# Patient Record
Sex: Female | Born: 1940 | Race: White | Hispanic: No | Marital: Married | Smoking: Former smoker
Health system: Southern US, Community
[De-identification: ages and names within clinical notes are randomized; demographics above are authoritative.]

## PROBLEM LIST (undated history)

## (undated) DIAGNOSIS — R42 Dizziness and giddiness: Secondary | ICD-10-CM

## (undated) DIAGNOSIS — R55 Syncope and collapse: Secondary | ICD-10-CM

## (undated) DIAGNOSIS — M79646 Pain in unspecified finger(s): Secondary | ICD-10-CM

## (undated) DIAGNOSIS — R202 Paresthesia of skin: Secondary | ICD-10-CM

## (undated) DIAGNOSIS — S61011A Laceration without foreign body of right thumb without damage to nail, initial encounter: Secondary | ICD-10-CM

## (undated) HISTORY — DX: Paresthesia of skin: R20.2

## (undated) HISTORY — PX: URETER SURGERY: SHX823

## (undated) HISTORY — DX: Pain in unspecified finger(s): M79.646

## (undated) HISTORY — PX: TONSILLECTOMY: SUR1361

## (undated) HISTORY — DX: Dizziness and giddiness: R42

## (undated) HISTORY — DX: Syncope and collapse: R55

## (undated) HISTORY — DX: Laceration without foreign body of right thumb without damage to nail, initial encounter: S61.011A

---

## 1998-05-25 ENCOUNTER — Other Ambulatory Visit: Admission: RE | Admit: 1998-05-25 | Discharge: 1998-05-25 | Payer: Self-pay

## 1998-08-03 ENCOUNTER — Other Ambulatory Visit: Admission: RE | Admit: 1998-08-03 | Discharge: 1998-08-03 | Payer: Self-pay | Admitting: Obstetrics and Gynecology

## 1999-08-23 ENCOUNTER — Other Ambulatory Visit: Admission: RE | Admit: 1999-08-23 | Discharge: 1999-08-23 | Payer: Self-pay | Admitting: Obstetrics and Gynecology

## 2000-08-26 ENCOUNTER — Other Ambulatory Visit: Admission: RE | Admit: 2000-08-26 | Discharge: 2000-08-26 | Payer: Self-pay | Admitting: Obstetrics and Gynecology

## 2001-04-02 ENCOUNTER — Ambulatory Visit (HOSPITAL_COMMUNITY): Admission: RE | Admit: 2001-04-02 | Discharge: 2001-04-02 | Payer: Self-pay | Admitting: Gastroenterology

## 2001-04-02 ENCOUNTER — Encounter (INDEPENDENT_AMBULATORY_CARE_PROVIDER_SITE_OTHER): Payer: Self-pay | Admitting: Specialist

## 2001-10-25 ENCOUNTER — Other Ambulatory Visit: Admission: RE | Admit: 2001-10-25 | Discharge: 2001-10-25 | Payer: Self-pay | Admitting: Obstetrics and Gynecology

## 2001-11-23 ENCOUNTER — Encounter: Admission: RE | Admit: 2001-11-23 | Discharge: 2002-02-21 | Payer: Self-pay | Admitting: Obstetrics and Gynecology

## 2002-04-01 ENCOUNTER — Ambulatory Visit (HOSPITAL_COMMUNITY): Admission: RE | Admit: 2002-04-01 | Discharge: 2002-04-01 | Payer: Self-pay | Admitting: Gastroenterology

## 2002-04-01 ENCOUNTER — Encounter (INDEPENDENT_AMBULATORY_CARE_PROVIDER_SITE_OTHER): Payer: Self-pay | Admitting: Specialist

## 2002-11-17 ENCOUNTER — Other Ambulatory Visit: Admission: RE | Admit: 2002-11-17 | Discharge: 2002-11-17 | Payer: Self-pay | Admitting: Obstetrics and Gynecology

## 2004-02-29 ENCOUNTER — Other Ambulatory Visit: Admission: RE | Admit: 2004-02-29 | Discharge: 2004-02-29 | Payer: Self-pay | Admitting: Obstetrics and Gynecology

## 2005-04-15 ENCOUNTER — Other Ambulatory Visit: Admission: RE | Admit: 2005-04-15 | Discharge: 2005-04-15 | Payer: Self-pay | Admitting: Obstetrics and Gynecology

## 2010-07-10 ENCOUNTER — Encounter: Admission: RE | Admit: 2010-07-10 | Discharge: 2010-07-10 | Payer: Self-pay | Admitting: Gastroenterology

## 2010-07-10 IMAGING — CT CT ABD-PEL WO/W CM
3 of 8 series · 13 of 36 positions shown, 17 images · IV contrast (OMNI 300, WATER & [ID] OMNI 300)
Comparison: None.

CLINICAL DATA: Follow up of submucosal cecal mass adjacent to the
appendix noted on colonoscopy

CT ABDOMEN AND PELVIS WITHOUT AND WITH CONTRAST
TECHNIQUE: Multidetector CT imaging of the abdomen and pelvis was
performed without contrast material in one or both body regions,
followed by contrast material(s) and further sections in one or
both body regions.
Contrast: 100 ml [5F]

[Series 3: abd w/(date) · axial · 0.70mm/px · z∈[-199,-124]mm · 2 of 46 slices shown]
[im 16/46  soft-tissue]
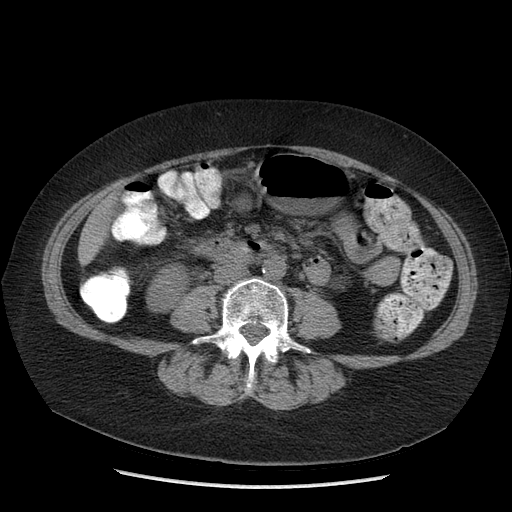
[im 31/46  soft-tissue]
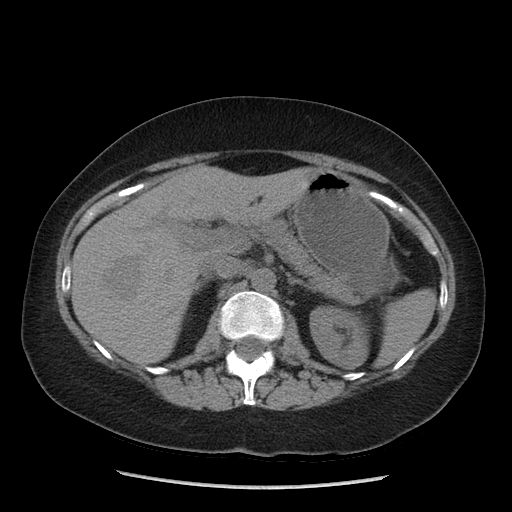

[Series 5: routine abdomen · axial · 0.70mm/px · z∈[-294,-124]mm · 3 of 67 slices shown, 7 images]
[im 17/67  soft-tissue]
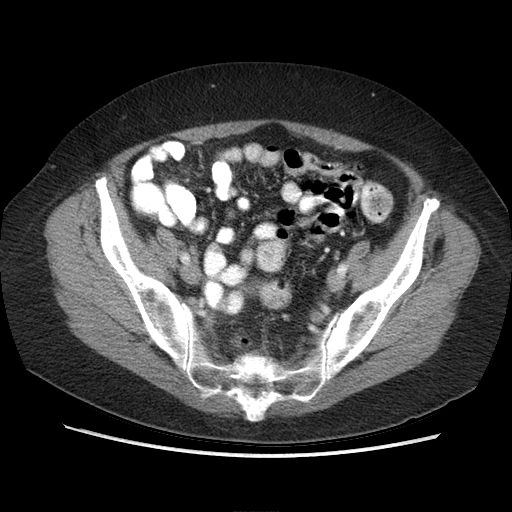
[im 17/67  lung]
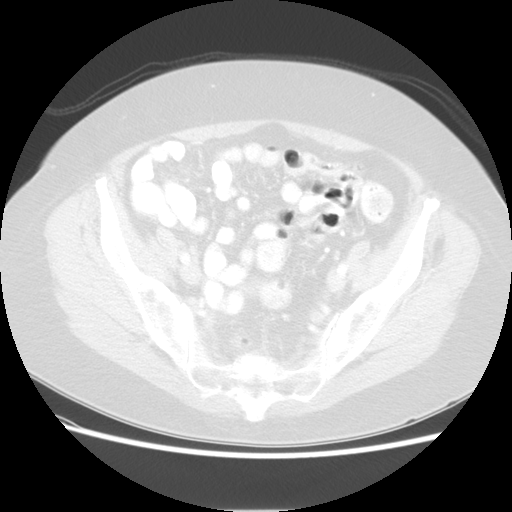
[im 17/67  bone]
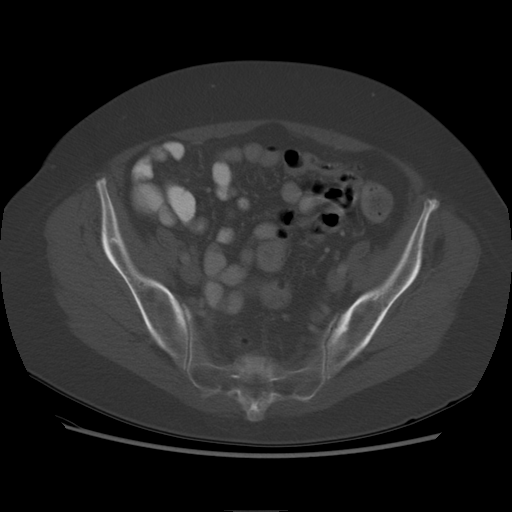
[im 34/67  soft-tissue]
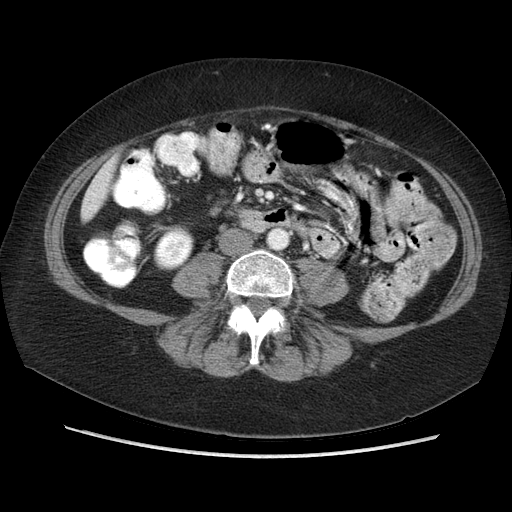
[im 34/67  lung]
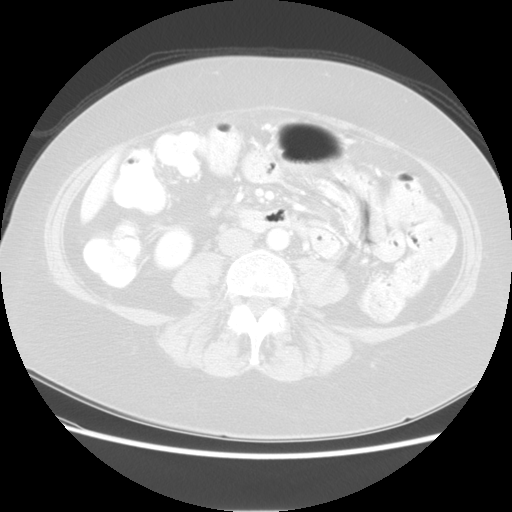
[im 50/67  soft-tissue]
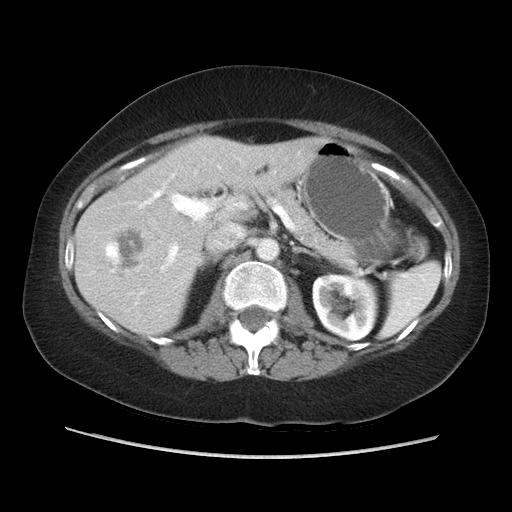
[im 50/67  lung]
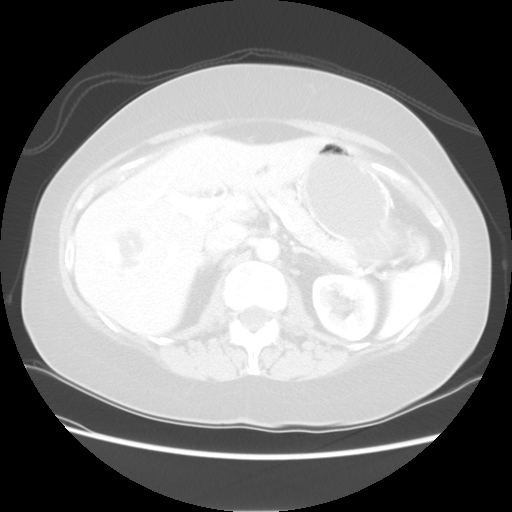

[Series 605: sagittal body · sagittal · 0.77mm/px · 8 of 145 slices shown]
[im 17/145  soft-tissue]
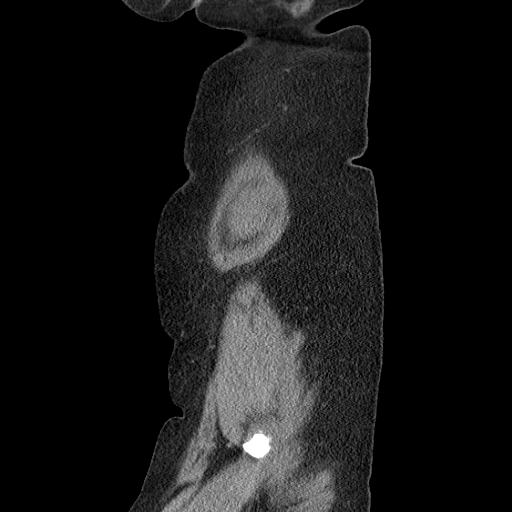
[im 33/145  soft-tissue]
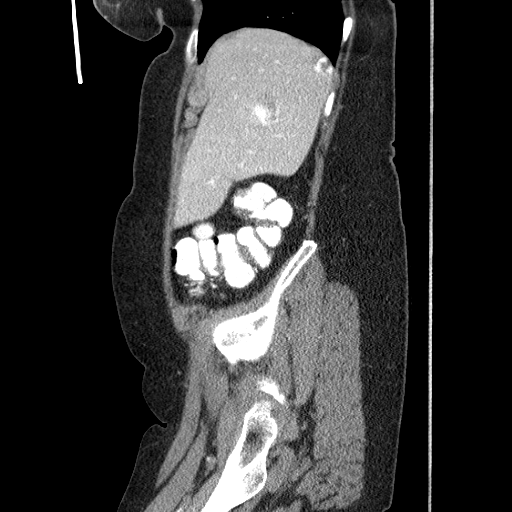
[im 49/145  soft-tissue]
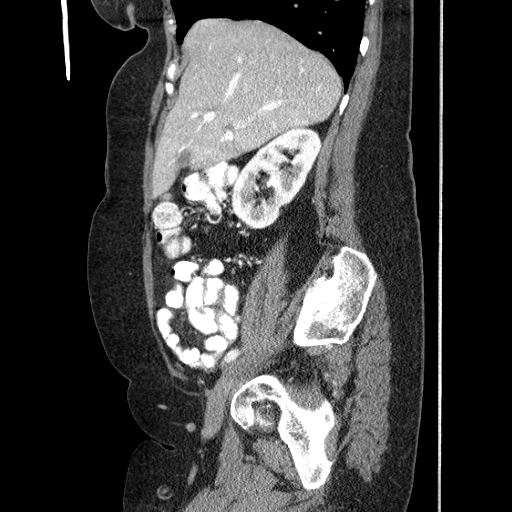
[im 65/145  soft-tissue]
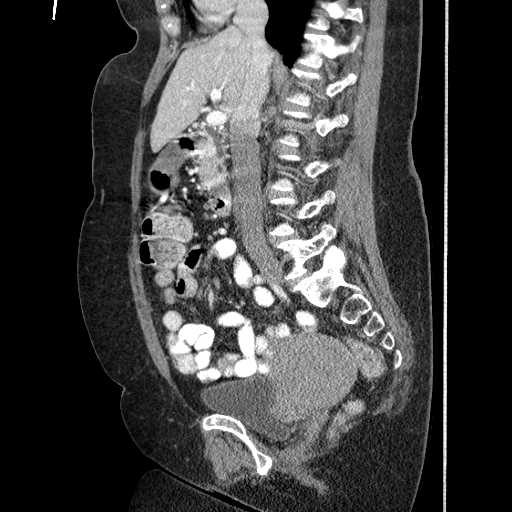
[im 81/145  soft-tissue]
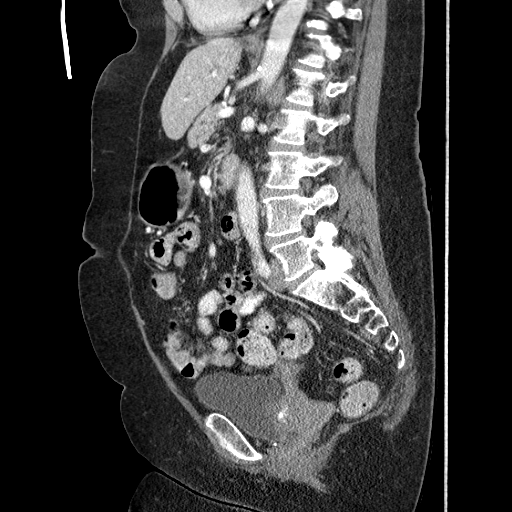
[im 97/145  soft-tissue]
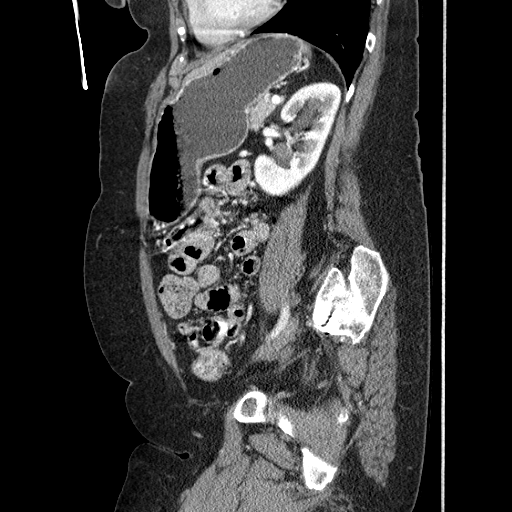
[im 113/145  soft-tissue]
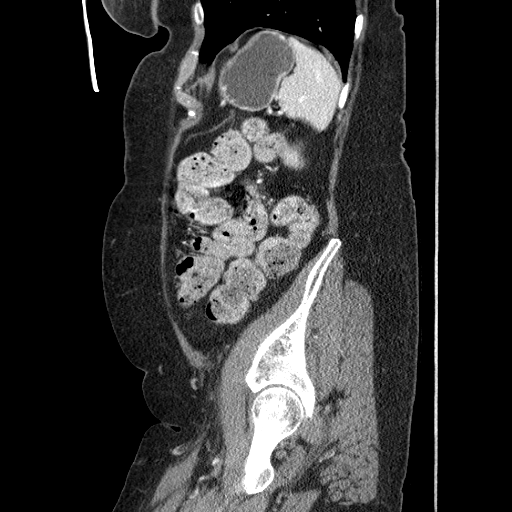
[im 129/145  soft-tissue]
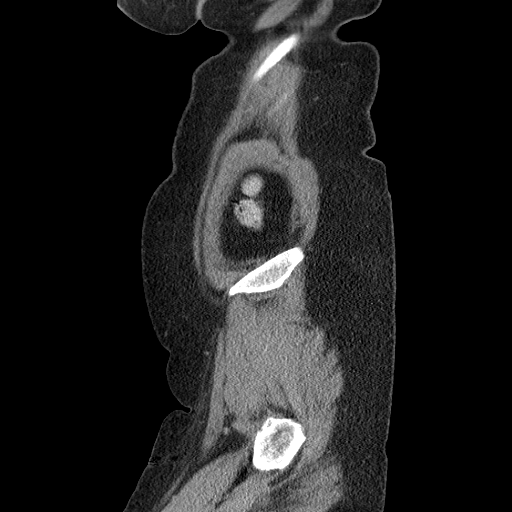

[13 of 36 positions shown; findings below may reference images not displayed]

FINDINGS: The lung bases are clear.  On the unenhanced study, there
is a low attenuation lesion in the mid right lobe of liver.  No
calcified gallstones are seen and no renal calculi are noted.

After contrast administration, there are two lesions in the right
lobe of liver which demonstrate peripheral nodular enhancement.  On
delayed images these areas become slightly hyperdense relative to
the adjacent liver and these are consistent with hemangiomas with
probable some fatty infiltration of the liver. The larger of these
two hepatic lesions measures 36 x 30 mm with the smaller measuring
14 x 15 mm.  No ductal dilatation is seen.  The gallbladder is
unremarkable.  The pancreas is normal in size and the pancreatic
duct is not dilated. The adrenal glands and spleen are
unremarkable.  The kidneys enhance and there is a right inferior -
medial parapelvic cyst present.  However, there is fullness of the
left pelvis and proximal left ureter with slightly higher
attenuation on unenhanced images.  On delayed images this enhanced
portion represents filling defect within the left renal pelvis and
proximal left ureter.  The considerations are that of blood clot
versus tumor such as transitional cell carcinoma.  Is there a
history of hematuria?

The abdominal aorta is normal in caliber.  No adenopathy is seen.
The ileocecal valve is noted to be somewhat nodular and lipomatous.
However, no soft tissue mass is seen adjacent to the cecum as
questioned on colonoscopy.  The terminal ileum is unremarkable.
The appendix is not well seen.  A few small nodes are present
within the right lower quadrant none of which are pathologically
enlarged.  The uterus is slightly prominent and inhomogeneous and
there is a calcified uterine fibroid present.  The urinary bladder
is unremarkable. No adnexal lesion is seen and no fluid is noted
within the pelvis.  A blastic focus in L5 vertebral body probably
represents a benign bone island.
IMPRESSION: 1.  There is filling defect within the left renal pelvis of
proximally, with some dilatation of the pelvocaliceal system on the
left.  This could represent either blood clot or tumor such as
transitional cell carcinoma.
2.  No soft tissue mass is seen adjacent to the cecum.  The
appendix is not definitely visualized. Probable lipomatou am I
always the last to know?

s ileocecal valve.
3.  Two lesions within the liver most consistent with hemangiomas.

 I discussed these findings with Dr. YAYGIN at [DATE] p.m.

## 2010-07-31 ENCOUNTER — Ambulatory Visit (HOSPITAL_BASED_OUTPATIENT_CLINIC_OR_DEPARTMENT_OTHER): Admission: RE | Admit: 2010-07-31 | Discharge: 2010-07-31 | Payer: Self-pay | Admitting: Urology

## 2010-07-31 IMAGING — CR DG CHEST 2V
2 series · 2 of 2 positions shown · non-contrast
Comparison: None

CLINICAL DATA: Preoperative respiratory exam for urologic surgery.
Possible carcinoma.

CHEST - 2 VIEW

[w chest pa]
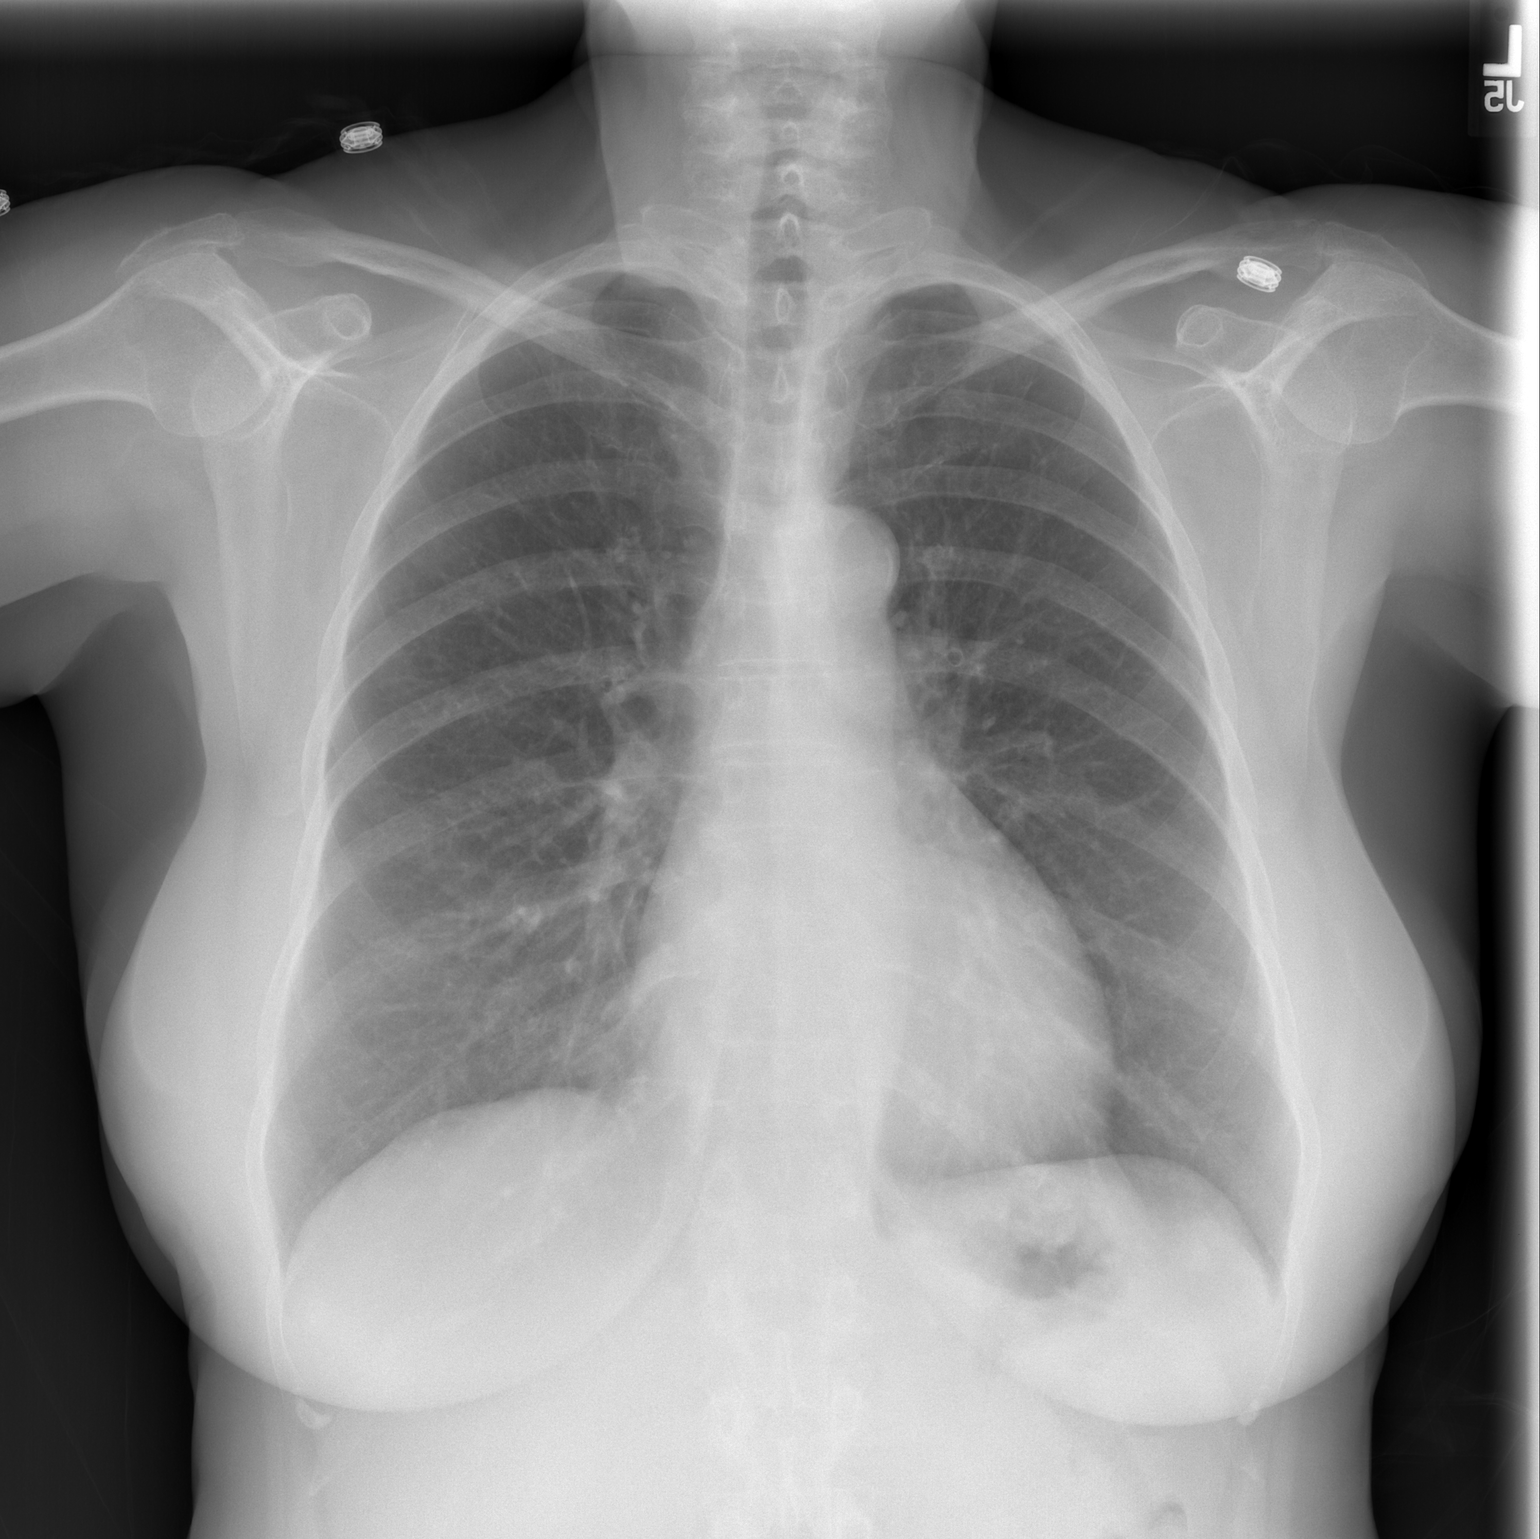

[w chest lat]
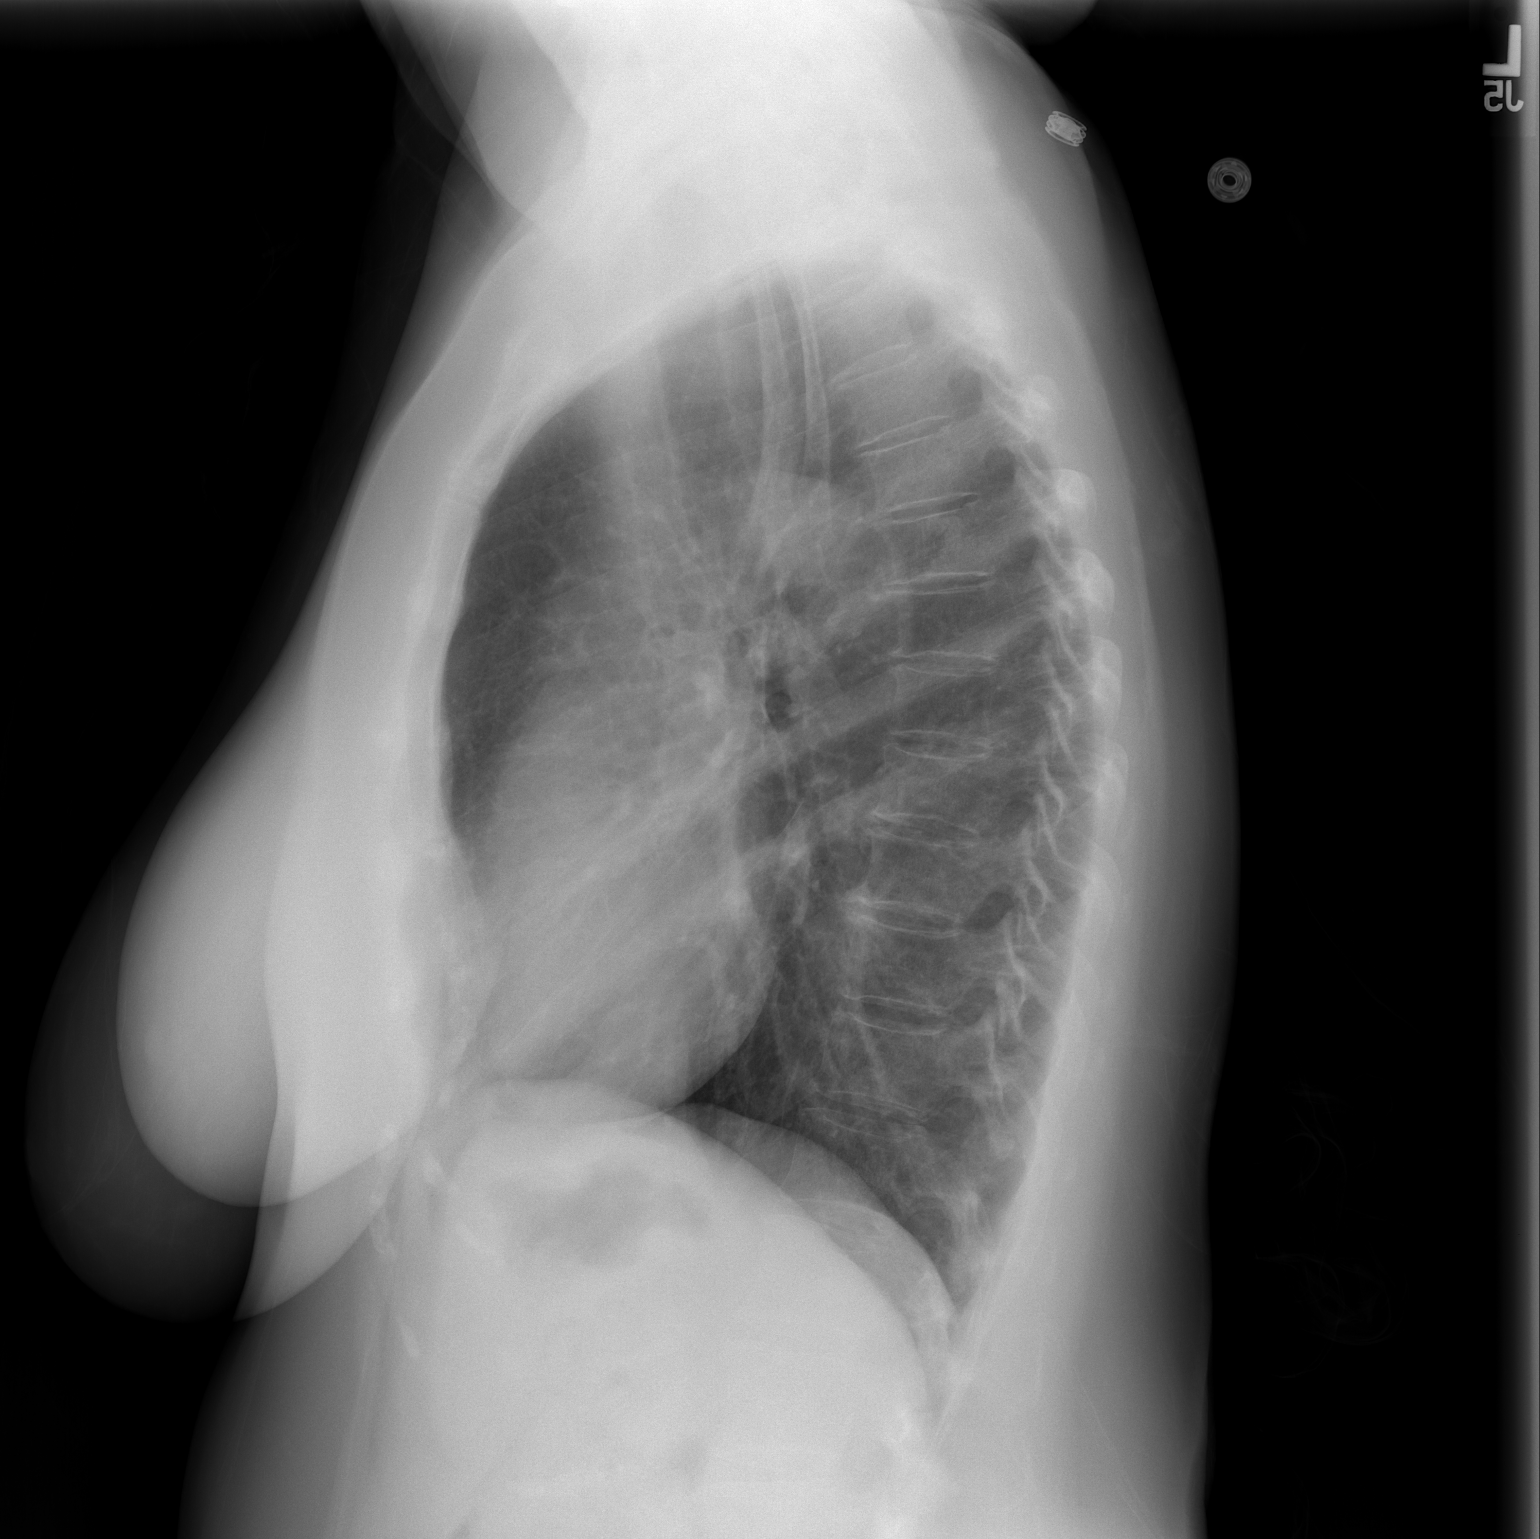

[2 of 2 positions shown; findings below may reference images not displayed]

FINDINGS: Heart size is normal.  Mediastinal shadows are normal
except for calcification of the thoracic aorta.  Lungs are clear.
The vascularity is normal.  Ordinary degenerative changes effect
the spine.
IMPRESSION: No active disease.

## 2010-11-23 ENCOUNTER — Encounter: Payer: Self-pay | Admitting: Family Medicine

## 2011-01-16 LAB — POCT HEMOGLOBIN-HEMACUE: Hemoglobin: 13.2 g/dL (ref 12.0–15.0)

## 2013-09-28 ENCOUNTER — Other Ambulatory Visit: Payer: Self-pay | Admitting: Obstetrics and Gynecology

## 2013-09-28 DIAGNOSIS — R928 Other abnormal and inconclusive findings on diagnostic imaging of breast: Secondary | ICD-10-CM

## 2013-10-17 ENCOUNTER — Ambulatory Visit
Admission: RE | Admit: 2013-10-17 | Discharge: 2013-10-17 | Disposition: A | Discharge status: Home / Self Care | Payer: Medicare HMO | Source: Ambulatory Visit | Attending: Obstetrics and Gynecology | Admitting: Obstetrics and Gynecology

## 2013-10-17 DIAGNOSIS — R928 Other abnormal and inconclusive findings on diagnostic imaging of breast: Secondary | ICD-10-CM

## 2014-06-05 ENCOUNTER — Other Ambulatory Visit: Payer: Self-pay | Admitting: Obstetrics and Gynecology

## 2014-06-05 DIAGNOSIS — N6489 Other specified disorders of breast: Secondary | ICD-10-CM

## 2014-06-12 ENCOUNTER — Ambulatory Visit
Admission: RE | Admit: 2014-06-12 | Discharge: 2014-06-12 | Disposition: A | Discharge status: Home / Self Care | Payer: Medicare HMO | Source: Ambulatory Visit | Attending: Obstetrics and Gynecology | Admitting: Obstetrics and Gynecology

## 2014-06-12 DIAGNOSIS — N6489 Other specified disorders of breast: Secondary | ICD-10-CM

## 2014-09-26 ENCOUNTER — Other Ambulatory Visit: Payer: Self-pay | Admitting: Obstetrics and Gynecology

## 2014-09-26 DIAGNOSIS — N6489 Other specified disorders of breast: Secondary | ICD-10-CM

## 2014-09-29 LAB — CYTOLOGY - PAP

## 2014-10-16 ENCOUNTER — Ambulatory Visit
Admission: RE | Admit: 2014-10-16 | Discharge: 2014-10-16 | Disposition: A | Discharge status: Home / Self Care | Payer: Medicare HMO | Source: Ambulatory Visit | Attending: Obstetrics and Gynecology | Admitting: Obstetrics and Gynecology

## 2014-10-16 DIAGNOSIS — N6489 Other specified disorders of breast: Secondary | ICD-10-CM

## 2015-07-30 ENCOUNTER — Other Ambulatory Visit: Payer: Self-pay | Admitting: Family Medicine

## 2015-07-30 DIAGNOSIS — N6489 Other specified disorders of breast: Secondary | ICD-10-CM

## 2015-11-12 ENCOUNTER — Ambulatory Visit
Admission: RE | Admit: 2015-11-12 | Discharge: 2015-11-12 | Disposition: A | Discharge status: Home / Self Care | Payer: Medicare Other | Source: Ambulatory Visit | Attending: Family Medicine | Admitting: Family Medicine

## 2015-11-12 DIAGNOSIS — N6489 Other specified disorders of breast: Secondary | ICD-10-CM

## 2016-10-13 ENCOUNTER — Other Ambulatory Visit: Payer: Self-pay | Admitting: Family Medicine

## 2016-10-13 DIAGNOSIS — Z1231 Encounter for screening mammogram for malignant neoplasm of breast: Secondary | ICD-10-CM

## 2016-11-24 ENCOUNTER — Ambulatory Visit
Admission: RE | Admit: 2016-11-24 | Discharge: 2016-11-24 | Disposition: A | Discharge status: Home / Self Care | Payer: Medicare Other | Source: Ambulatory Visit | Attending: Family Medicine | Admitting: Family Medicine

## 2016-11-24 DIAGNOSIS — Z1231 Encounter for screening mammogram for malignant neoplasm of breast: Secondary | ICD-10-CM

## 2016-11-25 ENCOUNTER — Other Ambulatory Visit: Payer: Self-pay | Admitting: Family Medicine

## 2016-11-25 DIAGNOSIS — R928 Other abnormal and inconclusive findings on diagnostic imaging of breast: Secondary | ICD-10-CM

## 2016-12-01 ENCOUNTER — Ambulatory Visit
Admission: RE | Admit: 2016-12-01 | Discharge: 2016-12-01 | Disposition: A | Discharge status: Home / Self Care | Payer: Medicare Other | Source: Ambulatory Visit | Attending: Family Medicine | Admitting: Family Medicine

## 2016-12-01 DIAGNOSIS — R928 Other abnormal and inconclusive findings on diagnostic imaging of breast: Secondary | ICD-10-CM

## 2017-10-23 ENCOUNTER — Other Ambulatory Visit: Payer: Self-pay | Admitting: Family Medicine

## 2017-10-23 DIAGNOSIS — Z139 Encounter for screening, unspecified: Secondary | ICD-10-CM

## 2017-12-02 ENCOUNTER — Ambulatory Visit
Admission: RE | Admit: 2017-12-02 | Discharge: 2017-12-02 | Disposition: A | Discharge status: Home / Self Care | Payer: Medicare Other | Source: Ambulatory Visit | Attending: Family Medicine | Admitting: Family Medicine

## 2017-12-02 DIAGNOSIS — Z139 Encounter for screening, unspecified: Secondary | ICD-10-CM

## 2018-12-22 ENCOUNTER — Other Ambulatory Visit: Payer: Self-pay | Admitting: Family Medicine

## 2018-12-22 DIAGNOSIS — Z1231 Encounter for screening mammogram for malignant neoplasm of breast: Secondary | ICD-10-CM

## 2019-01-27 ENCOUNTER — Ambulatory Visit: Payer: Medicare Other

## 2019-03-23 ENCOUNTER — Ambulatory Visit
Admission: RE | Admit: 2019-03-23 | Discharge: 2019-03-23 | Disposition: A | Discharge status: Home / Self Care | Payer: Medicare Other | Source: Ambulatory Visit | Attending: Family Medicine | Admitting: Family Medicine

## 2019-03-23 ENCOUNTER — Other Ambulatory Visit: Payer: Self-pay

## 2019-03-23 DIAGNOSIS — Z1231 Encounter for screening mammogram for malignant neoplasm of breast: Secondary | ICD-10-CM

## 2019-03-23 IMAGING — MG DIGITAL SCREENING BILATERAL MAMMOGRAM WITH TOMO AND CAD
8 series · 8 of 24 positions shown · non-contrast
Comparison: Previous exam(s).

CLINICAL DATA: Screening.

EXAM:
DIGITAL SCREENING BILATERAL MAMMOGRAM WITH TOMO AND CAD

[R MLO synth-2D]
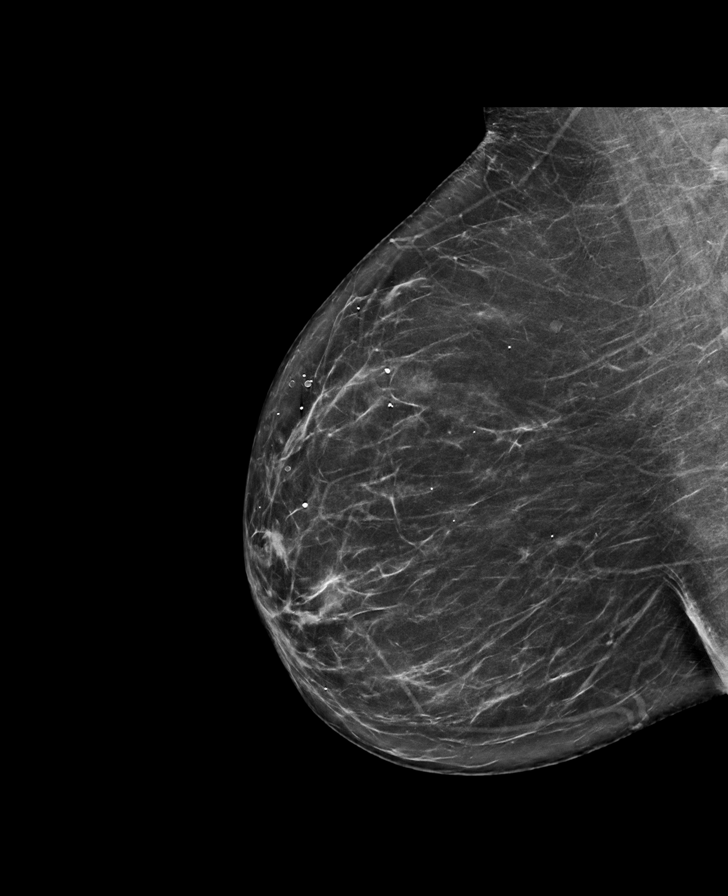

[L MLO synth-2D]
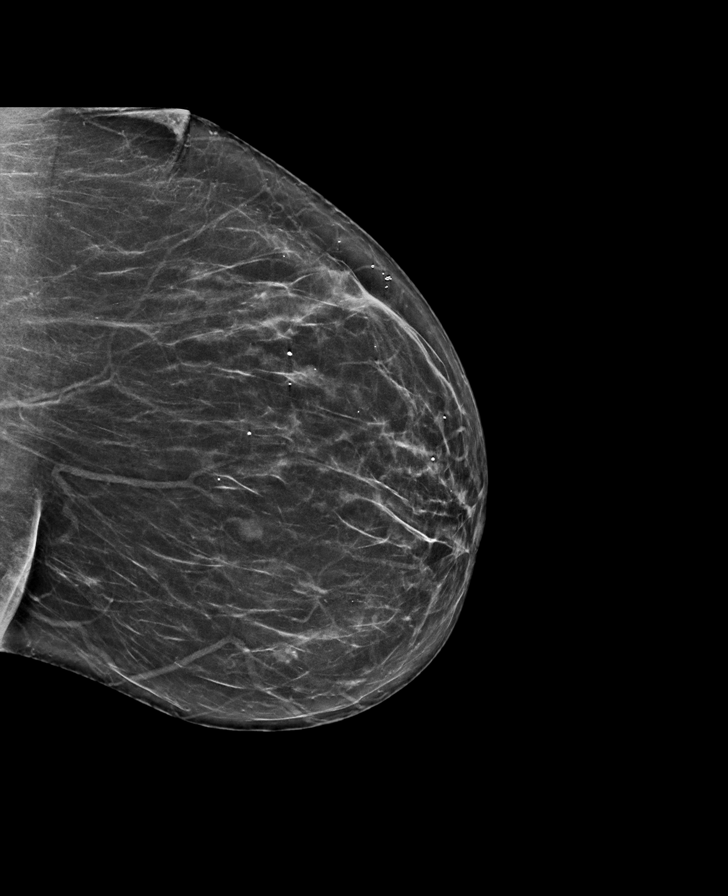

[R CC synth-2D]
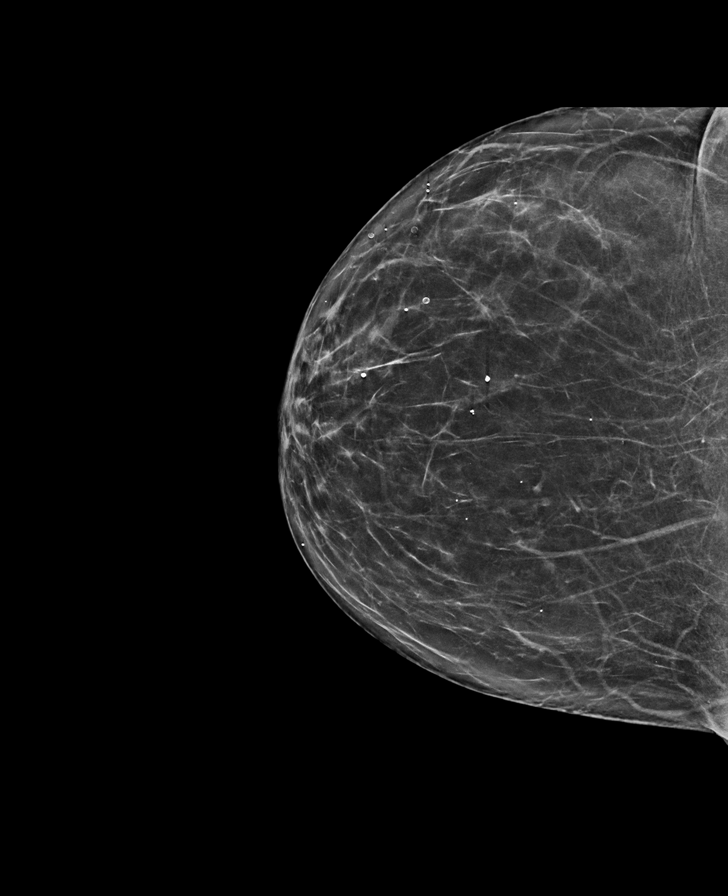

[L CC synth-2D]
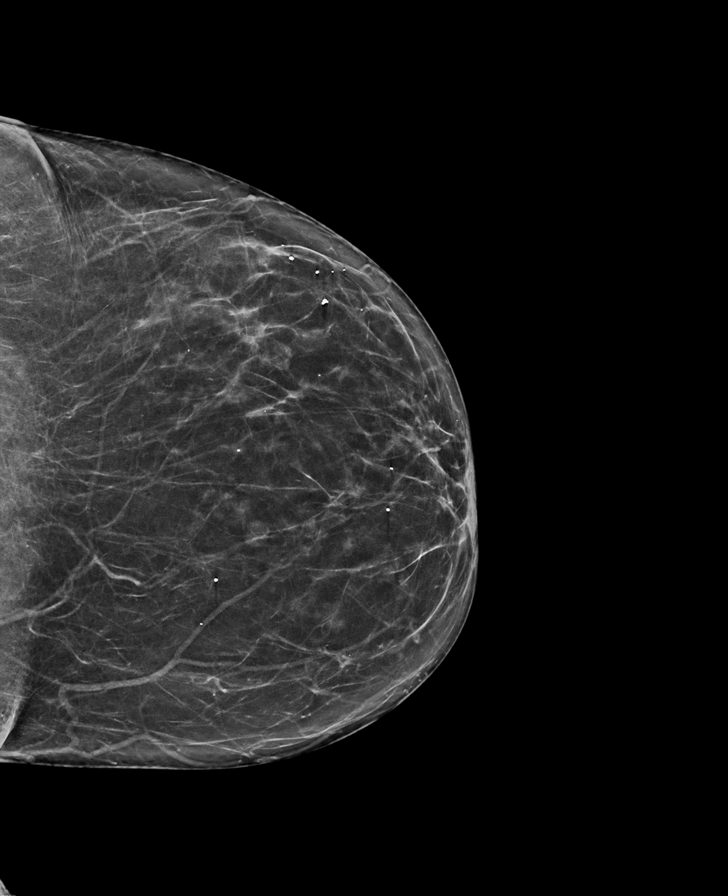

[L MLO tomo · tomo slice 35/69.0]
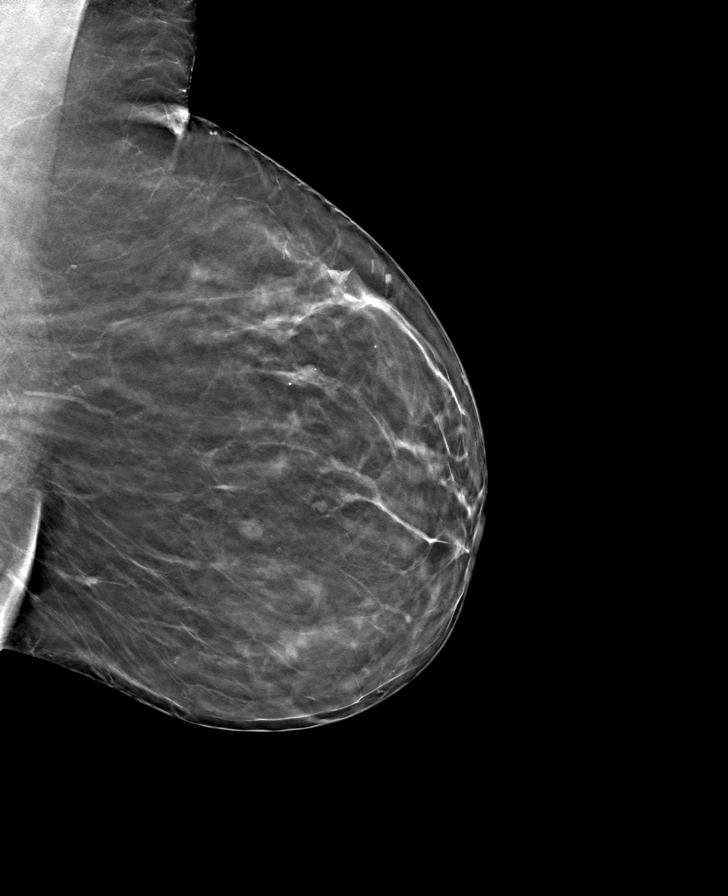

[L CC tomo · tomo slice 33/65.0]
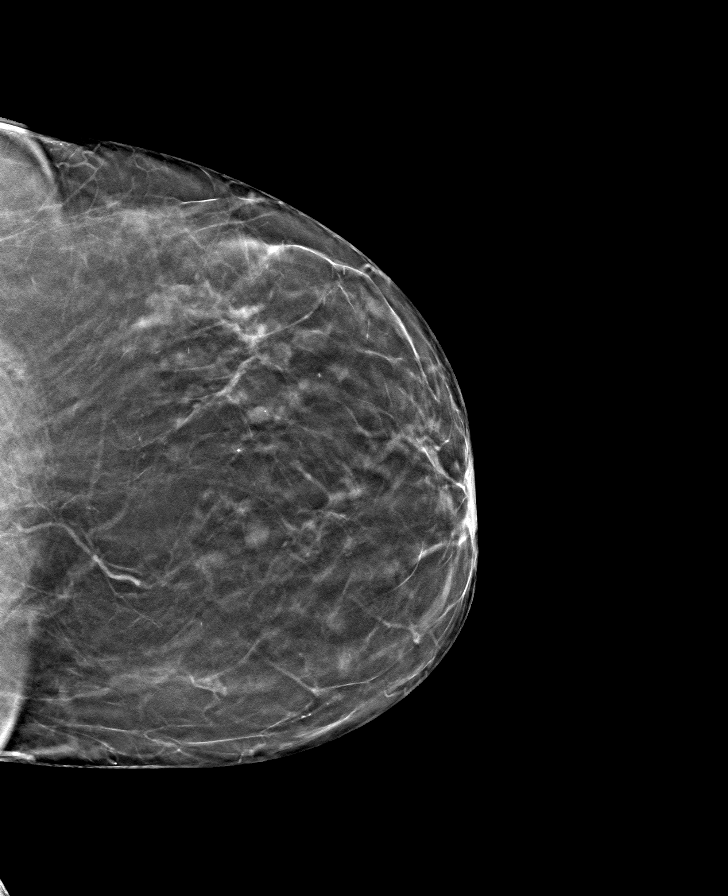

[R MLO tomo · tomo slice 37/74.0]
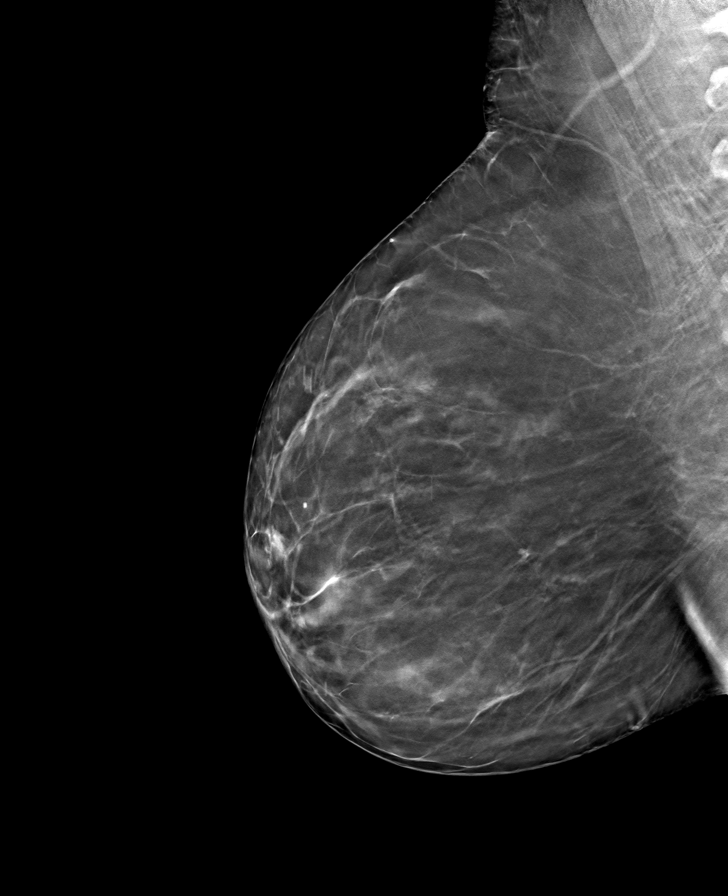

[R CC tomo · tomo slice 35/68.0]
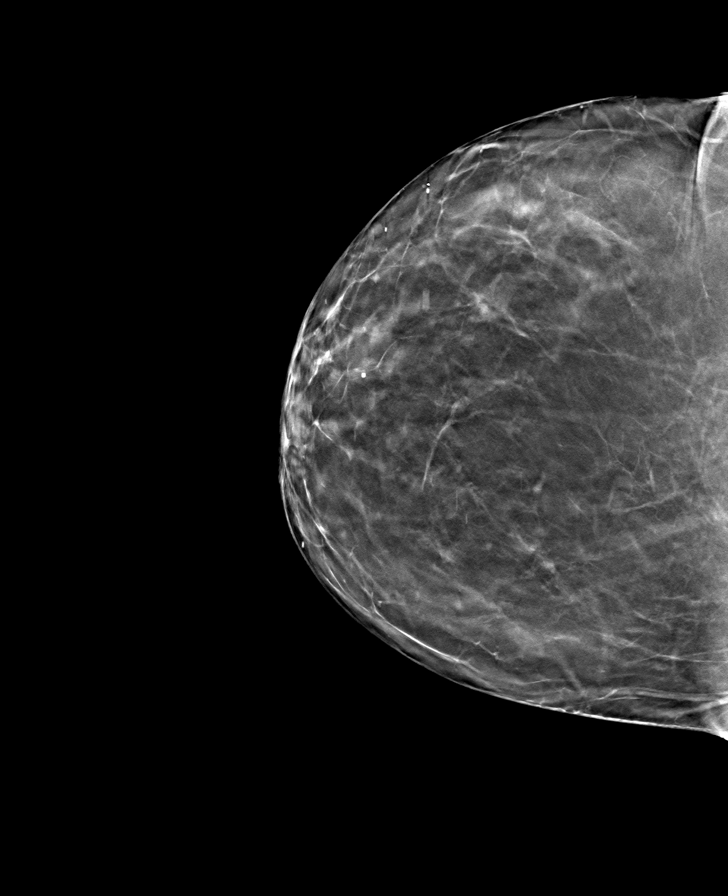

[8 of 24 positions shown; findings below may reference images not displayed]

ACR Breast Density Category b: There are scattered areas of
fibroglandular density.
FINDINGS: There are no findings suspicious for malignancy. Images were
processed with CAD.
IMPRESSION: No mammographic evidence of malignancy. A result letter of this
screening mammogram will be mailed directly to the patient.

RECOMMENDATION:
Screening mammogram in one year. (Code:[TQ])

BI-RADS CATEGORY  1: Negative.

## 2019-03-29 ENCOUNTER — Ambulatory Visit: Payer: Medicare Other

## 2019-11-15 ENCOUNTER — Ambulatory Visit: Payer: Medicare Other | Attending: Internal Medicine

## 2019-11-15 DIAGNOSIS — Z23 Encounter for immunization: Secondary | ICD-10-CM

## 2019-11-15 NOTE — Progress Notes (Signed)
   Covid-19 Vaccination Clinic  Name:  Cathy Andrews    MRN: [id number] DOB: 1941-03-04  11/15/2019  [name] was observed post Covid-19 immunization for 15 minutes without incidence. She was provided with Vaccine Information Sheet and instruction to access the V-Safe system.   [name] was instructed to call 911 with any severe reactions post vaccine: Marland Kitchen Difficulty breathing  . Swelling of your face and throat  . A fast heartbeat  . A bad rash all over your body  . Dizziness and weakness    Immunizations Administered    Name Date Dose VIS Date Route   Pfizer COVID-19 Vaccine 11/15/2019  9:45 AM 0.3 mL 10/14/2019 Intramuscular   Manufacturer: [employer], [employer]   Lot: [id number]   NDC: [address]-7

## 2019-12-03 ENCOUNTER — Ambulatory Visit: Payer: Self-pay

## 2019-12-03 ENCOUNTER — Ambulatory Visit: Payer: Medicare PPO | Attending: Internal Medicine

## 2019-12-03 DIAGNOSIS — Z23 Encounter for immunization: Secondary | ICD-10-CM | POA: Insufficient documentation

## 2019-12-03 NOTE — Progress Notes (Signed)
   Covid-19 Vaccination Clinic  Name:  TAKIYAH BOHNSACK    MRN: [id number] DOB: 05/16/1941  12/03/2019  Ms. Angulo was observed post Covid-19 immunization for 15 minutes without incidence. She was provided with Vaccine Information Sheet and instruction to access the V-Safe system.   Ms. Zendejas was instructed to call 911 with any severe reactions post vaccine: Marland Kitchen Difficulty breathing  . Swelling of your face and throat  . A fast heartbeat  . A bad rash all over your body  . Dizziness and weakness    Immunizations Administered    Name Date Dose VIS Date Route   Pfizer COVID-19 Vaccine 12/03/2019  1:42 PM 0.3 mL 10/14/2019 Intramuscular   Manufacturer: [employer], [employer]   Lot: [id number]   NDC: [address]-7

## 2020-01-31 DIAGNOSIS — H52223 Regular astigmatism, bilateral: Secondary | ICD-10-CM | POA: Diagnosis not present

## 2020-01-31 DIAGNOSIS — H5201 Hypermetropia, right eye: Secondary | ICD-10-CM | POA: Diagnosis not present

## 2020-01-31 DIAGNOSIS — H524 Presbyopia: Secondary | ICD-10-CM | POA: Diagnosis not present

## 2020-01-31 DIAGNOSIS — H35372 Puckering of macula, left eye: Secondary | ICD-10-CM | POA: Diagnosis not present

## 2020-01-31 DIAGNOSIS — H5212 Myopia, left eye: Secondary | ICD-10-CM | POA: Diagnosis not present

## 2020-01-31 DIAGNOSIS — Z961 Presence of intraocular lens: Secondary | ICD-10-CM | POA: Diagnosis not present

## 2020-02-14 ENCOUNTER — Other Ambulatory Visit: Payer: Self-pay | Admitting: Family Medicine

## 2020-02-14 DIAGNOSIS — Z1231 Encounter for screening mammogram for malignant neoplasm of breast: Secondary | ICD-10-CM

## 2020-03-23 ENCOUNTER — Ambulatory Visit: Payer: Medicare PPO

## 2020-06-05 ENCOUNTER — Ambulatory Visit: Payer: Medicare PPO

## 2020-07-08 DIAGNOSIS — S61011A Laceration without foreign body of right thumb without damage to nail, initial encounter: Secondary | ICD-10-CM | POA: Diagnosis not present

## 2020-07-08 DIAGNOSIS — Z23 Encounter for immunization: Secondary | ICD-10-CM | POA: Diagnosis not present

## 2020-08-01 DIAGNOSIS — Z4802 Encounter for removal of sutures: Secondary | ICD-10-CM | POA: Diagnosis not present

## 2020-08-01 DIAGNOSIS — S61011D Laceration without foreign body of right thumb without damage to nail, subsequent encounter: Secondary | ICD-10-CM | POA: Diagnosis not present

## 2020-09-04 DIAGNOSIS — Z6827 Body mass index (BMI) 27.0-27.9, adult: Secondary | ICD-10-CM | POA: Diagnosis not present

## 2020-09-04 DIAGNOSIS — Z882 Allergy status to sulfonamides status: Secondary | ICD-10-CM | POA: Diagnosis not present

## 2020-09-04 DIAGNOSIS — E663 Overweight: Secondary | ICD-10-CM | POA: Diagnosis not present

## 2020-09-04 DIAGNOSIS — Z87891 Personal history of nicotine dependence: Secondary | ICD-10-CM | POA: Diagnosis not present

## 2020-11-28 DIAGNOSIS — Z1389 Encounter for screening for other disorder: Secondary | ICD-10-CM | POA: Diagnosis not present

## 2020-11-28 DIAGNOSIS — Z0000 Encounter for general adult medical examination without abnormal findings: Secondary | ICD-10-CM | POA: Diagnosis not present

## 2020-12-25 DIAGNOSIS — M79645 Pain in left finger(s): Secondary | ICD-10-CM | POA: Insufficient documentation

## 2020-12-26 DIAGNOSIS — M79645 Pain in left finger(s): Secondary | ICD-10-CM | POA: Diagnosis not present

## 2020-12-26 DIAGNOSIS — M65332 Trigger finger, left middle finger: Secondary | ICD-10-CM | POA: Diagnosis not present

## 2020-12-27 DIAGNOSIS — L84 Corns and callosities: Secondary | ICD-10-CM | POA: Diagnosis not present

## 2020-12-27 DIAGNOSIS — Z6827 Body mass index (BMI) 27.0-27.9, adult: Secondary | ICD-10-CM | POA: Diagnosis not present

## 2021-01-30 ENCOUNTER — Emergency Department (HOSPITAL_COMMUNITY)
Admission: EM | Admit: 2021-01-30 | Discharge: 2021-01-30 | Disposition: A | Discharge status: Home / Self Care | Payer: Medicare PPO | Attending: Emergency Medicine | Admitting: Emergency Medicine

## 2021-01-30 ENCOUNTER — Encounter (HOSPITAL_COMMUNITY): Payer: Self-pay

## 2021-01-30 ENCOUNTER — Other Ambulatory Visit: Payer: Self-pay

## 2021-01-30 DIAGNOSIS — T40711A Poisoning by cannabis, accidental (unintentional), initial encounter: Secondary | ICD-10-CM | POA: Diagnosis not present

## 2021-01-30 DIAGNOSIS — R42 Dizziness and giddiness: Secondary | ICD-10-CM | POA: Diagnosis not present

## 2021-01-30 DIAGNOSIS — R9431 Abnormal electrocardiogram [ECG] [EKG]: Secondary | ICD-10-CM | POA: Diagnosis not present

## 2021-01-30 DIAGNOSIS — R29810 Facial weakness: Secondary | ICD-10-CM | POA: Diagnosis not present

## 2021-01-30 DIAGNOSIS — I499 Cardiac arrhythmia, unspecified: Secondary | ICD-10-CM | POA: Diagnosis not present

## 2021-01-30 DIAGNOSIS — Z87891 Personal history of nicotine dependence: Secondary | ICD-10-CM | POA: Diagnosis not present

## 2021-01-30 DIAGNOSIS — R55 Syncope and collapse: Secondary | ICD-10-CM | POA: Diagnosis not present

## 2021-01-30 DIAGNOSIS — I491 Atrial premature depolarization: Secondary | ICD-10-CM | POA: Diagnosis not present

## 2021-01-30 LAB — RAPID URINE DRUG SCREEN, HOSP PERFORMED
Amphetamines: NOT DETECTED
Barbiturates: NOT DETECTED
Benzodiazepines: NOT DETECTED
Cocaine: NOT DETECTED
Opiates: NOT DETECTED
Tetrahydrocannabinol: POSITIVE — AB

## 2021-01-30 LAB — BASIC METABOLIC PANEL
Anion gap: 7 (ref 5–15)
BUN: 17 mg/dL (ref 8–23)
CO2: 25 mmol/L (ref 22–32)
Calcium: 9.6 mg/dL (ref 8.9–10.3)
Chloride: 105 mmol/L (ref 98–111)
Creatinine, Ser: 0.65 mg/dL (ref 0.44–1.00)
GFR, Estimated: >60 mL/min (ref >60)
Glucose, Bld: 120 mg/dL — ABNORMAL HIGH (ref 70–99)
Potassium: 4 mmol/L (ref 3.5–5.1)
Sodium: 137 mmol/L (ref 135–145)

## 2021-01-30 LAB — TROPONIN I (HIGH SENSITIVITY)
Troponin I (High Sensitivity): 4 ng/L (ref <18)
Troponin I (High Sensitivity): 4 ng/L (ref <18)

## 2021-01-30 LAB — CBC
HCT: 41.9 % (ref 36.0–46.0)
Hemoglobin: 13.5 g/dL (ref 12.0–15.0)
MCH: 29.8 pg (ref 26.0–34.0)
MCHC: 32.2 g/dL (ref 30.0–36.0)
MCV: 92.5 fL (ref 80.0–100.0)
Platelets: 256 10*3/uL (ref 150–400)
RBC: 4.53 MIL/uL (ref 3.87–5.11)
RDW: 13.4 % (ref 11.5–15.5)
WBC: 6.8 10*3/uL (ref 4.0–10.5)
nRBC: 0 % (ref 0.0–0.2)

## 2021-01-30 NOTE — ED Provider Notes (Signed)
[facility] EMERGENCY DEPARTMENT Provider Note   CSN: [id number] Arrival date & time: 01/30/21  1232     History Chief Complaint  Patient presents with  . Loss of Consciousness    Cathy Andrews is a 80 y.o. female.  HPI Patient presents with a syncopal episode.  Went to her eye doctor today.  States she drove there.  However she had taken a THC gummy before going.  States that she began to feel her hand moving slowly in different direction and felt as if it was going.  States she began to feel weird and apparently passed out.  States she woke up  States she still feels off and feels if she having an out of body experience.  States she is hearing everything twice.  Had told EMS that she was seeing the future.  Patient took a full THC gummy this morning around 10:00.  She had had this gummy previously in November when she was in [place] but only took half of it at that time.  No chest pain.  No trouble breathing.  Is not had episodes like this previously.    History reviewed. No pertinent past medical history.  There are no problems to display for this patient.   Past Surgical History:  Procedure Laterality Date  . TONSILLECTOMY    . URETER SURGERY       OB History   No obstetric history on file.     Family History  Problem Relation Age of Onset  . Breast cancer Neg Hx     Social History   Tobacco Use  . Smoking status: Former Smoker    Types: Cigarettes  . Smokeless tobacco: Never Used  Vaping Use  . Vaping Use: Never used  Substance Use Topics  . Alcohol use: Yes    Alcohol/week: 7.0 standard drinks    Types: 7 Glasses of wine per week    Comment: usually 1 glass of red wine daily, occasionally 1 more on weekend    Home Medications Prior to Admission medications   Not on File    Allergies    Sulfa antibiotics  Review of Systems   Review of Systems  Constitutional: Negative for appetite change.  HENT: Negative for congestion.    Respiratory: Negative for shortness of breath.   Cardiovascular: Negative for chest pain.  Gastrointestinal: Negative for abdominal pain.  Genitourinary: Negative for flank pain.  Musculoskeletal: Negative for back pain.  Skin: Negative for rash.  Neurological: Positive for syncope.  Psychiatric/Behavioral: Positive for hallucinations.    Physical Exam Updated Vital Signs BP 130/67   Pulse 60   Temp 97.8 F (36.6 C) (Oral)   Resp 13   SpO2 98%   Physical Exam Vitals and nursing note reviewed.  Constitutional:      Appearance: Normal appearance.  HENT:     Head: Atraumatic.  Eyes:     Extraocular Movements: Extraocular movements intact.     Pupils: Pupils are equal, round, and reactive to light.  Cardiovascular:     Rate and Rhythm: Normal rate and regular rhythm.  Pulmonary:     Breath sounds: No wheezing or rhonchi.  Abdominal:     Tenderness: There is no abdominal tenderness.  Musculoskeletal:        General: No tenderness.     Cervical back: Neck supple.  Skin:    General: Skin is warm.     Capillary Refill: Capillary refill takes less than 2 seconds.  Neurological:  Mental Status: She is alert and oriented to person, place, and time.     Comments: Mildly slow to answer.     ED Results / Procedures / Treatments   Labs (all labs ordered are listed, but only abnormal results are displayed) Labs Reviewed  BASIC METABOLIC PANEL - Abnormal; Notable for the following components:      Result Value   Glucose, Bld 120 (*)    All other components within normal limits  RAPID URINE DRUG SCREEN, HOSP PERFORMED - Abnormal; Notable for the following components:   Tetrahydrocannabinol POSITIVE (*)    All other components within normal limits  CBC  TROPONIN I (HIGH SENSITIVITY)  TROPONIN I (HIGH SENSITIVITY)    EKG EKG Interpretation  Date/Time:  Wednesday January 30 2021 14:08:34 EDT Ventricular Rate:  61 PR Interval:  199 QRS Duration: 127 QT  Interval:  421 QTC Calculation: 424 R Axis:   -49 Text Interpretation: Sinus rhythm LVH with IVCD, LAD and secondary repol abnrm No significant change since last tracing Confirmed by Benjiman Core [phone]) on 01/30/2021 2:10:47 PM   Radiology No results found.  Procedures Procedures   Medications Ordered in ED Medications - No data to display  ED Course  I have reviewed the triage vital signs and the nursing notes.  Pertinent labs & imaging results that were available during my care of the patient were reviewed by me and considered in my medical decision making (see chart for details).    MDM Rules/Calculators/A&P                          Patient presents after syncopal episode.  Had been at eye doctor.  Think likely secondary to the marijuana gummy she took.  Had not had one at this dose before.  Work-up otherwise reassuring.  EKG reassuring.  Troponin negative.  Nonfocal exam.  Still mildly confused but do not think she needs further admission to the hospital.  Will discharge home.  Outpatient follow-up as needed.  Instructed on safe use of her Gummies. Final Clinical Impression(s) / ED Diagnoses Final diagnoses:  Syncope, unspecified syncope type  Overdose of marijuana, accidental or unintentional, initial encounter    Rx / DC Orders ED Discharge Orders    None       Benjiman Core, MD 01/30/21 1531

## 2021-01-30 NOTE — ED Triage Notes (Signed)
Pt comes from the eye Dr via EMS. EMS called by Dr who reported that pt had syncopal episode while sitting in chair, reported lasting approx 1 min. EMS reports pt was a/0x4 upon their arrival but pt states she is "having an out of body experience and seeing premonitions, seeing the future" per EMS. Pt's husband reports that pt and husband took THC gummy about 1000 this morning. 20g IV established in LAC, no meds given. BP 170/79 HR 67 O2 99% RA BGL 137

## 2021-02-11 NOTE — Progress Notes (Signed)
Syncopal episode at [facility]: Asked to evaluate for dizziness, facial/arm/leg tingling; pt and husband report she was seen at hospital and worked up last week for similar symptoms. Both decline EMT evaluation/ER visit. Will return home, advised to call primary MD ASAP to report symptoms, advised of concern for TIA/stroke and if symptoms return to call 911. Advised her to avoid driving, working out at gym until work up complete as this is recurring.

## 2021-02-13 ENCOUNTER — Emergency Department (HOSPITAL_COMMUNITY): Payer: Medicare PPO

## 2021-02-13 ENCOUNTER — Other Ambulatory Visit: Payer: Self-pay

## 2021-02-13 ENCOUNTER — Emergency Department (HOSPITAL_COMMUNITY)
Admission: EM | Admit: 2021-02-13 | Discharge: 2021-02-13 | Disposition: A | Discharge status: Home / Self Care | Payer: Medicare PPO | Attending: Emergency Medicine | Admitting: Emergency Medicine

## 2021-02-13 DIAGNOSIS — R55 Syncope and collapse: Secondary | ICD-10-CM | POA: Insufficient documentation

## 2021-02-13 DIAGNOSIS — R519 Headache, unspecified: Secondary | ICD-10-CM | POA: Diagnosis not present

## 2021-02-13 DIAGNOSIS — Z882 Allergy status to sulfonamides status: Secondary | ICD-10-CM | POA: Insufficient documentation

## 2021-02-13 DIAGNOSIS — R42 Dizziness and giddiness: Secondary | ICD-10-CM | POA: Insufficient documentation

## 2021-02-13 DIAGNOSIS — R002 Palpitations: Secondary | ICD-10-CM | POA: Insufficient documentation

## 2021-02-13 DIAGNOSIS — I493 Ventricular premature depolarization: Secondary | ICD-10-CM | POA: Diagnosis not present

## 2021-02-13 DIAGNOSIS — R200 Anesthesia of skin: Secondary | ICD-10-CM | POA: Diagnosis not present

## 2021-02-13 DIAGNOSIS — R202 Paresthesia of skin: Secondary | ICD-10-CM | POA: Insufficient documentation

## 2021-02-13 DIAGNOSIS — Z87891 Personal history of nicotine dependence: Secondary | ICD-10-CM | POA: Diagnosis not present

## 2021-02-13 LAB — CBC
HCT: 40.8 % (ref 36.0–46.0)
Hemoglobin: 13.2 g/dL (ref 12.0–15.0)
MCH: 29.9 pg (ref 26.0–34.0)
MCHC: 32.4 g/dL (ref 30.0–36.0)
MCV: 92.3 fL (ref 80.0–100.0)
Platelets: 262 10*3/uL (ref 150–400)
RBC: 4.42 MIL/uL (ref 3.87–5.11)
RDW: 13.4 % (ref 11.5–15.5)
WBC: 7 10*3/uL (ref 4.0–10.5)
nRBC: 0 % (ref 0.0–0.2)

## 2021-02-13 LAB — BASIC METABOLIC PANEL
Anion gap: 8 (ref 5–15)
BUN: 18 mg/dL (ref 8–23)
CO2: 23 mmol/L (ref 22–32)
Calcium: 9.3 mg/dL (ref 8.9–10.3)
Chloride: 105 mmol/L (ref 98–111)
Creatinine, Ser: 0.67 mg/dL (ref 0.44–1.00)
GFR, Estimated: >60 mL/min (ref >60)
Glucose, Bld: 101 mg/dL — ABNORMAL HIGH (ref 70–99)
Potassium: 3.8 mmol/L (ref 3.5–5.1)
Sodium: 136 mmol/L (ref 135–145)

## 2021-02-13 LAB — MAGNESIUM: Magnesium: 2.1 mg/dL (ref 1.7–2.4)

## 2021-02-13 IMAGING — MR MR MRA HEAD W/O CM
1 series · 17 of 48 positions shown · non-contrast
Comparison: None available.

CLINICAL DATA: Initial evaluation for ongoing dizziness, numbness,
tingling, stroke suspected.



[Series 5: 3d cow · axial · 0.5mm · 0.41mm/px · z∈[-50,+30]mm · 17 of 172 slices shown]
[im 1/172]
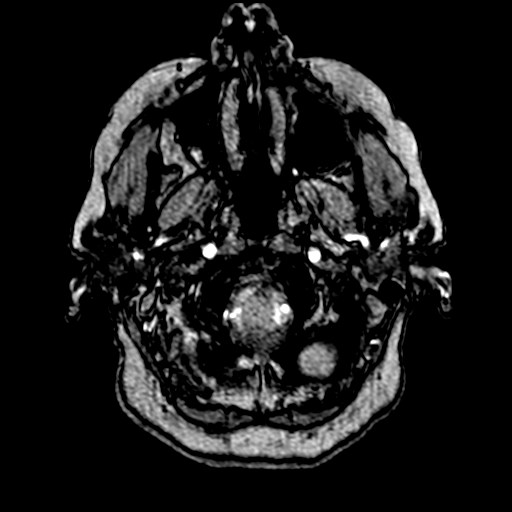
[im 4/172]
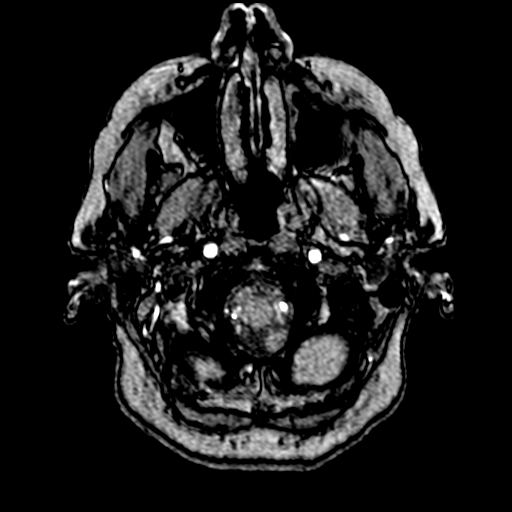
[im 8/172]
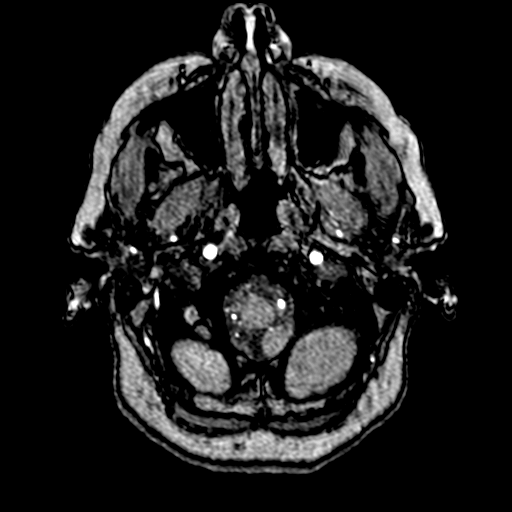
[im 11/172]
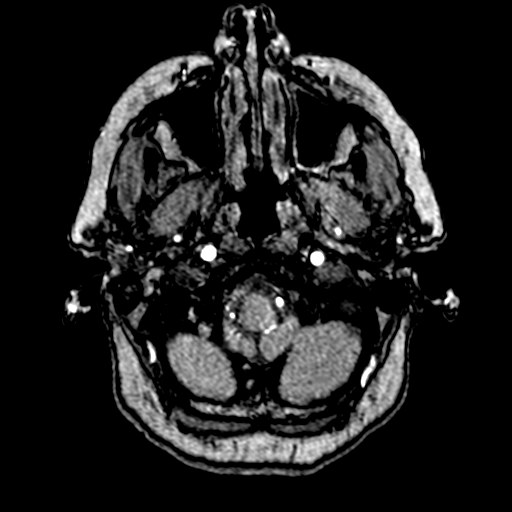
[im 15/172]
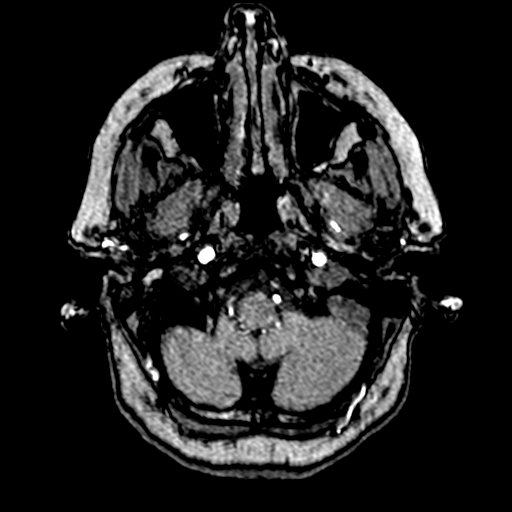
[im 19/172]
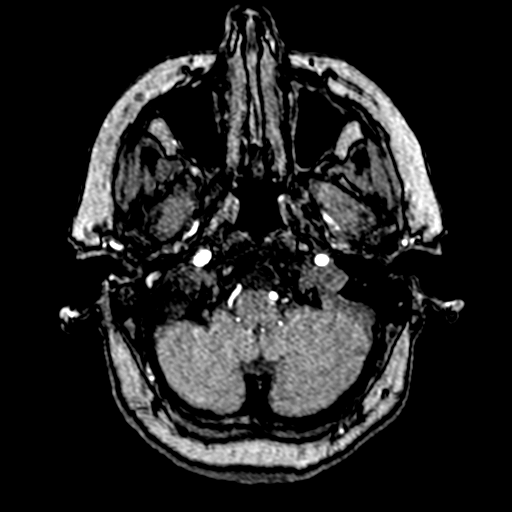
[im 22/172]
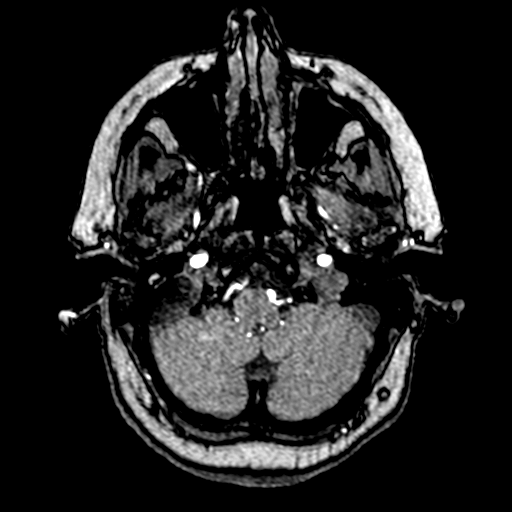
[im 30/172]
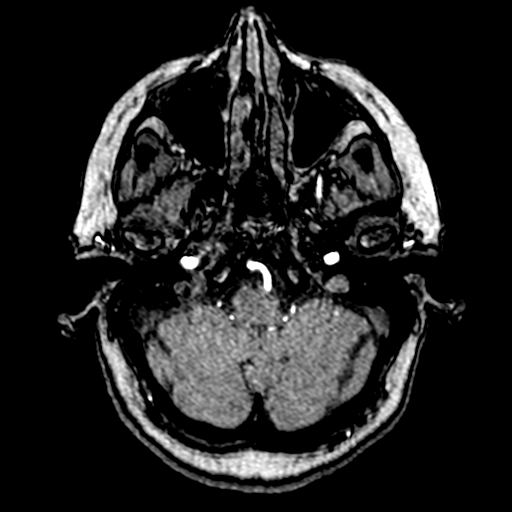
[im 33/172]
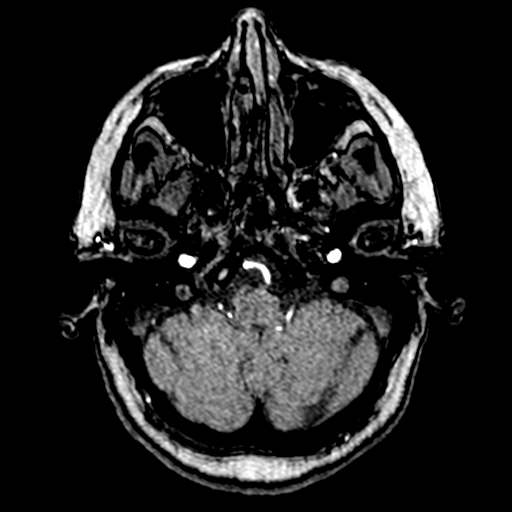
[im 55/172]
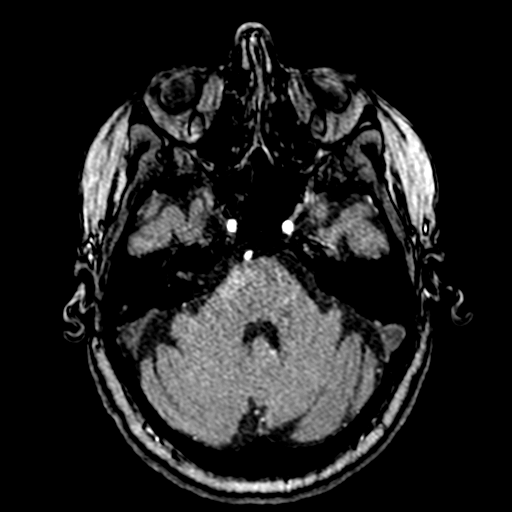
[im 77/172]
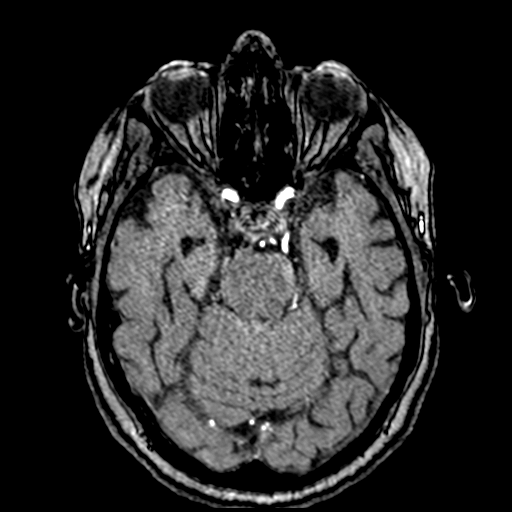
[im 88/172]
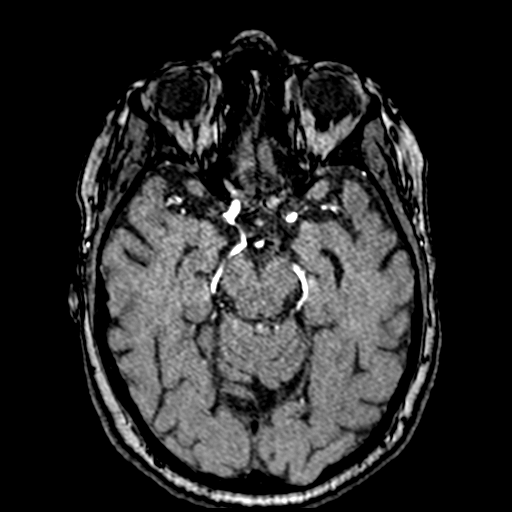
[im 99/172]
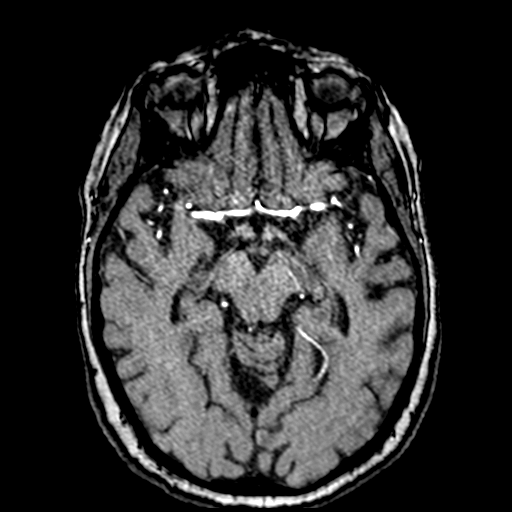
[im 121/172]
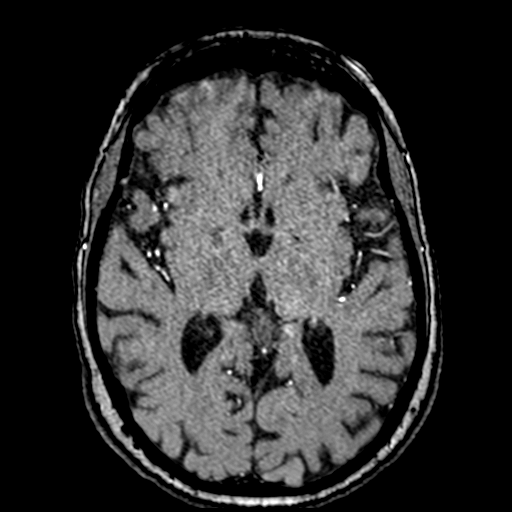
[im 142/172]
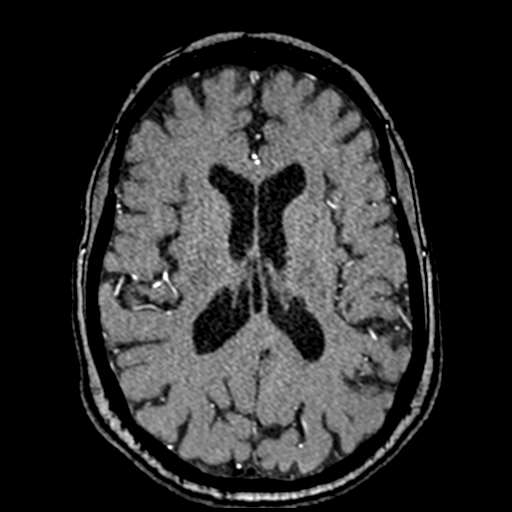
[im 146/172]
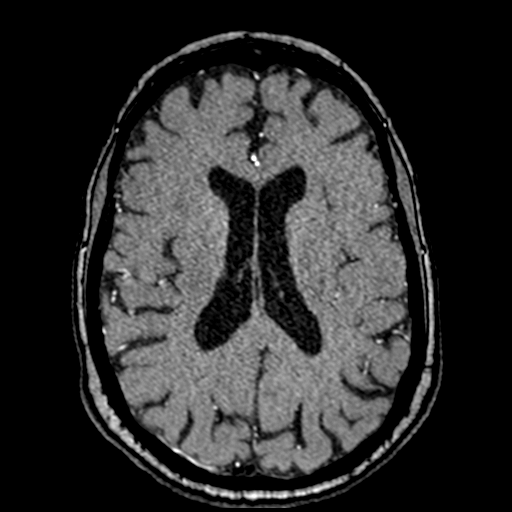
[im 164/172]
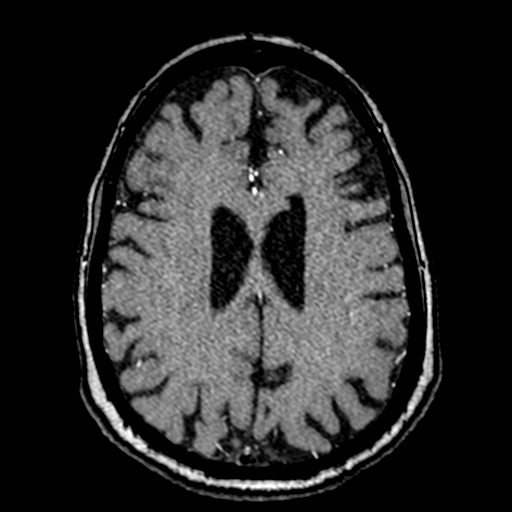

[17 of 48 positions shown; findings below may reference images not displayed]

FINDINGS: MRI HEAD FINDINGS

Brain: Mild diffuse prominence of the CSF containing spaces
compatible with generalized cerebral atrophy, most pronounced at the
anterior temporal lobes bilaterally. Single 7 mm focus of FLAIR
hyperintensity noted within the periventricular white matter of the
left frontal corona radiata (series 11, image 15), nonspecific, but
of doubtful significance in isolation.

No abnormal foci of restricted diffusion to suggest acute or
subacute ischemia. Gray-white matter differentiation maintained. No
encephalomalacia to suggest chronic cortical infarction. No evidence
for acute or chronic intracranial hemorrhage.

No mass lesion, midline shift or mass effect. No hydrocephalus or
extra-axial fluid collection. Pituitary gland suprasellar region
within normal limits. Midline structures intact.

Vascular: Major intracranial vascular flow voids are well
maintained.

Skull and upper cervical spine: Craniocervical junction normal. Bone
marrow signal intensity within normal limits. No scalp soft tissue
abnormality.

Sinuses/Orbits: Globes and orbital soft tissues demonstrate no acute
finding. Patient status post ocular lens replacement on the left.
Mild scattered mucosal thickening noted within the ethmoidal air
cells and maxillary sinuses. No air-fluid levels to suggest acute
sinusitis. Mastoid air cells are largely clear. Inner ear structures
grossly normal.

Other: None.

MRA HEAD FINDINGS

ANTERIOR CIRCULATION:

Visualized distal cervical segments of the internal carotid arteries
are widely patent with antegrade flow. Petrous, cavernous, and
supraclinoid segments widely patent without stenosis or other
abnormality. A1 segments widely patent. Normal anterior
communicating artery complex. Anterior cerebral arteries patent to
their distal aspects without stenosis. No M1 stenosis or occlusion.
Distal MCA branches well perfused and symmetric.

POSTERIOR CIRCULATION:

Left V4 segment dominant and widely patent to the vertebrobasilar
junction. Hypoplastic right V4 segment largely terminates in PICA,
although a tiny branch ascending towards the vertebrobasilar
junction. Both PICA origins patent and normal. Basilar diminutive
but patent to its distal aspect without stenosis. Superior
cerebellar arteries patent bilaterally. Fetal type origin of the
left PCA. Right PCA supplied via a hypoplastic right P1 segment and
robust right posterior communicating artery. Both PCAs well perfused
or distal aspects without stenosis.

No intracranial aneurysm or other vascular abnormality.

MRA NECK FINDINGS

AORTIC ARCH: Visualized portions of the aortic arch normal caliber
with normal branch pattern. No hemodynamically significant stenosis
seen about the origin of the great vessels.

RIGHT CAROTID SYSTEM: Right common carotid artery widely patent to
the bifurcation without stenosis. No significant atheromatous
narrowing about the right carotid bifurcation. Right ICA widely
patent distally without stenosis, evidence for dissection or
occlusion.

LEFT CAROTID SYSTEM: Left CCA patent from its origin to the
bifurcation without stenosis. No significant atheromatous narrowing
about the left bifurcation. Left ICA patent distally to the skull
base without stenosis, evidence for dissection or occlusion.

VERTEBRAL ARTERIES: Both vertebral arteries arise from subclavian
arteries. Left vertebral artery dominant. Visualized vertebral
arteries widely patent with antegrade flow, with no stenosis,
evidence for dissection or occlusion.
IMPRESSION: 1. Normal brain MRI for age.  No acute intracranial abnormality.
2. Normal intracranial MRA.
3. Normal MRA of the neck.

## 2021-02-13 IMAGING — MR MR HEAD W/O CM
10 of 11 series · 41 of 48 positions shown · non-contrast
Comparison: None available.

CLINICAL DATA: Initial evaluation for ongoing dizziness, numbness,
tingling, stroke suspected.



[Series 5: DWI · axial · 3.0mm · 0.88mm/px · z∈[-54,+92]mm · 8 of 100 slices shown (1 of 4)]
[im 1/100]
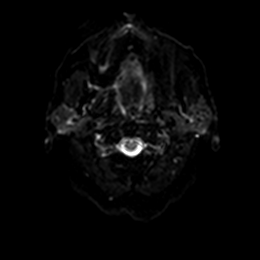
[im 12/100]
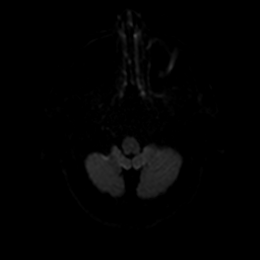
[im 34/100]
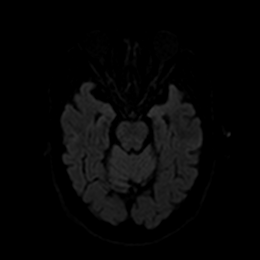
[im 45/100]
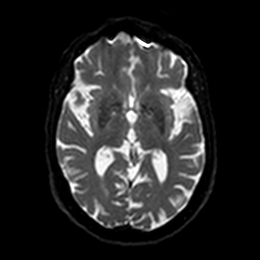
[im 56/100]
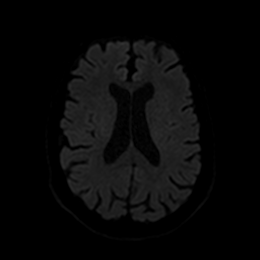
[im 67/100]
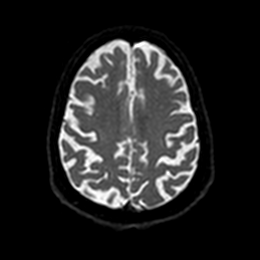
[im 89/100]
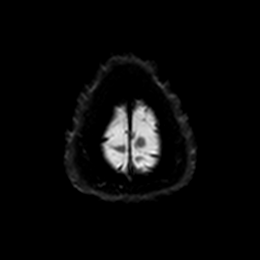
[im 100/100]
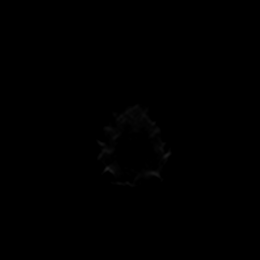

[Series 6: DWI · axial · 3.0mm · 0.88mm/px · z∈[-54,+92]mm · 5 of 50 slices shown (2 of 4)]
[im 1/50]
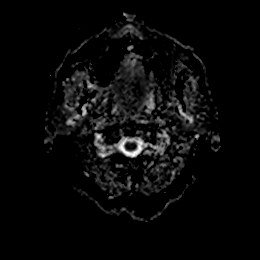
[im 13/50]
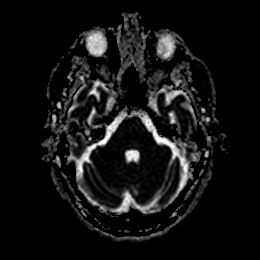
[im 25/50]
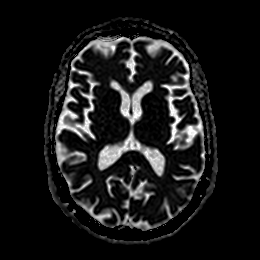
[im 37/50]
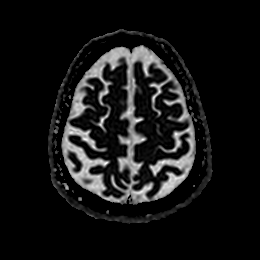
[im 50/50]
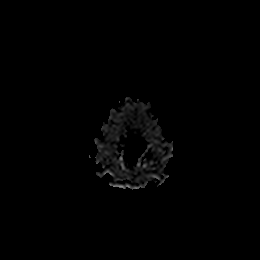

[Series 7: DWI · coronal · 4.0mm · 0.88mm/px · 6 of 64 slices shown (3 of 4)]
[im 1/64]
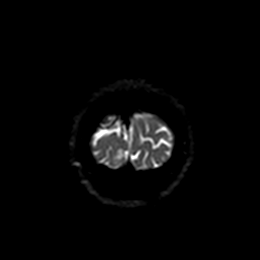
[im 13/64]
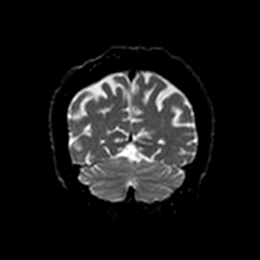
[im 26/64]
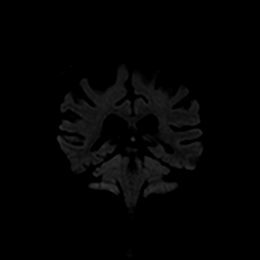
[im 38/64]
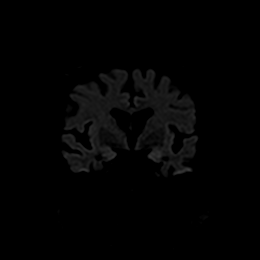
[im 51/64]
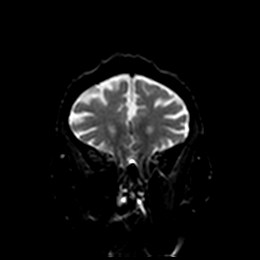
[im 64/64]
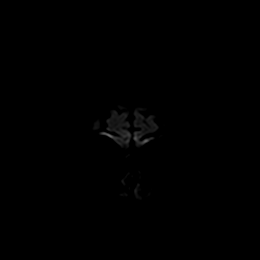

[Series 8: DWI · coronal · 4.0mm · 0.88mm/px · 3 of 32 slices shown (4 of 4)]
[im 1/32]
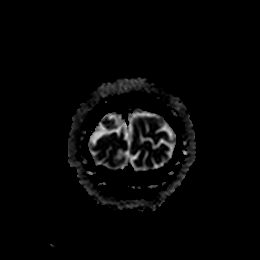
[im 16/32]
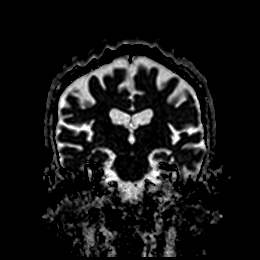
[im 32/32]
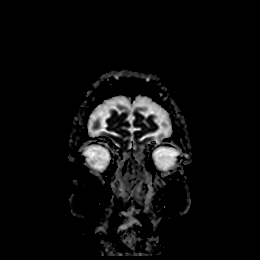

[Series 9: T1 · sagittal · 5.0mm · 0.75mm/px · 2 of 23 slices shown]
[im 1/23]
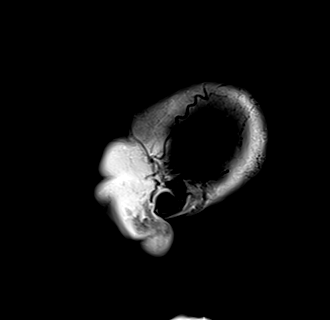
[im 23/23]
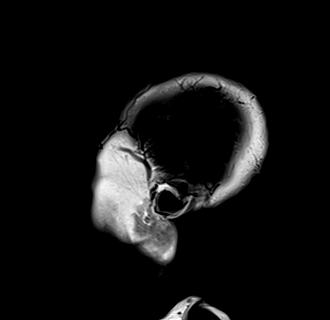

[Series 10: T2 · axial · 5.0mm · 0.72mm/px · z∈[-52,+91]mm · 2 of 25 slices shown (1 of 2)]
[im 1/25]
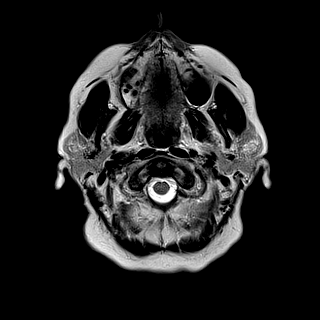
[im 25/25]
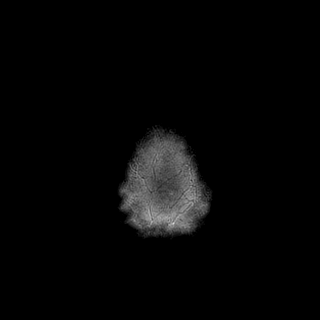

[Series 11: FLAIR · axial · 5.0mm · 0.45mm/px · z∈[-52,+91]mm · 2 of 25 slices shown]
[im 1/25]
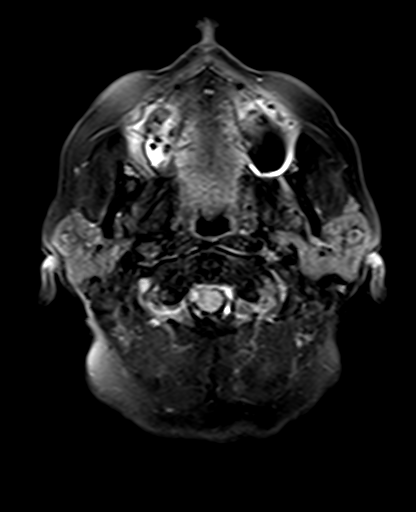
[im 25/25]
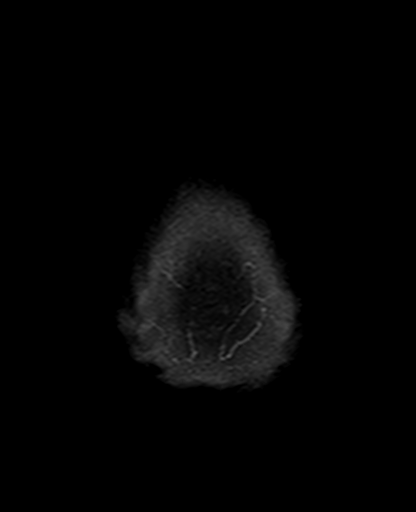

[Series 13: pha_images · axial · 3.0mm · 0.90mm/px · z∈[-66,+107]mm · 5 of 57 slices shown]
[im 1/57]
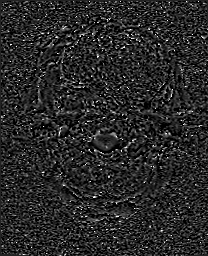
[im 15/57]
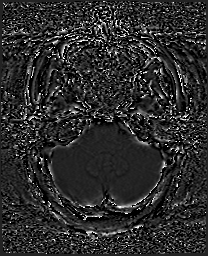
[im 29/57]
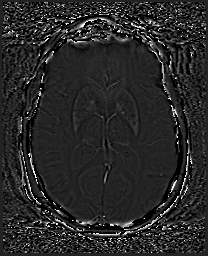
[im 43/57]
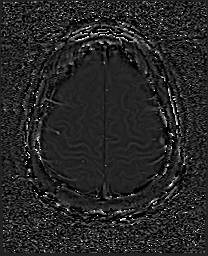
[im 57/57]
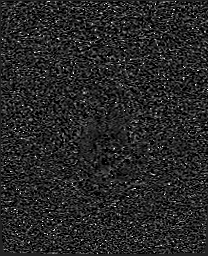

[Series 14: swi_images · axial · 3.0mm · 0.90mm/px · z∈[-69,+107]mm · 5 of 60 slices shown]
[im 1/60]
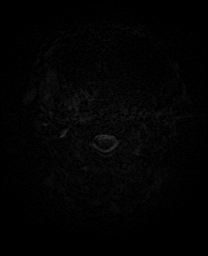
[im 15/60]
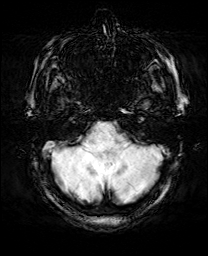
[im 30/60]
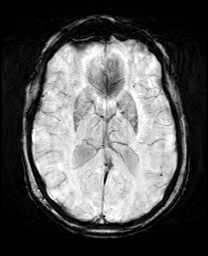
[im 45/60]
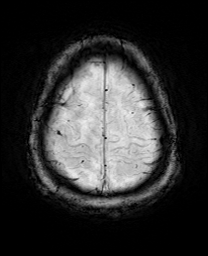
[im 60/60]
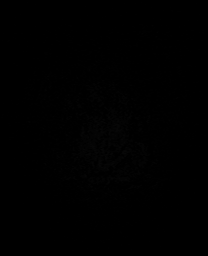

[Series 17: T2 · coronal · 5.0mm · 0.34mm/px · 3 of 29 slices shown (2 of 2)]
[im 1/29]
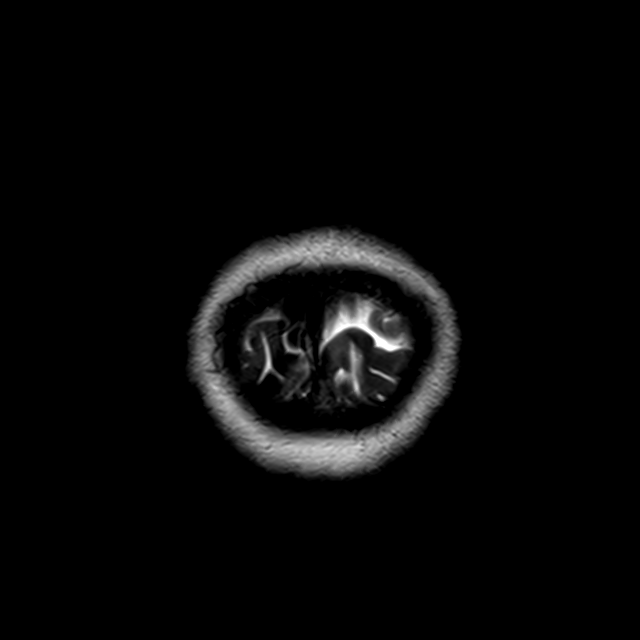
[im 15/29]
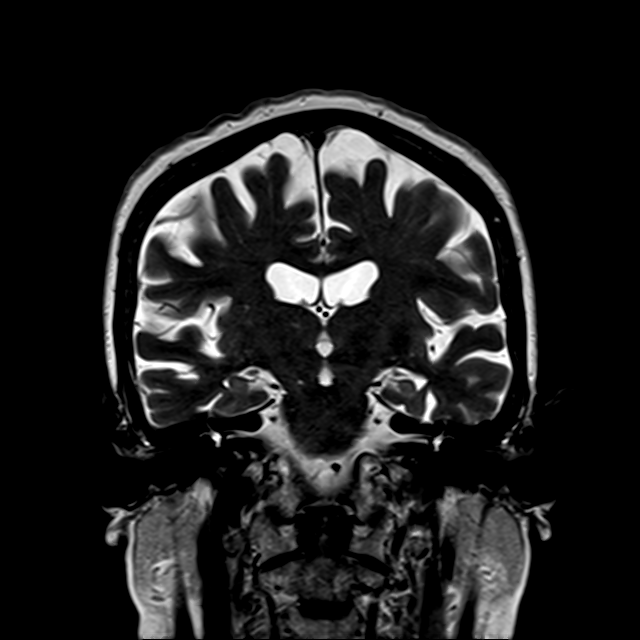
[im 29/29]
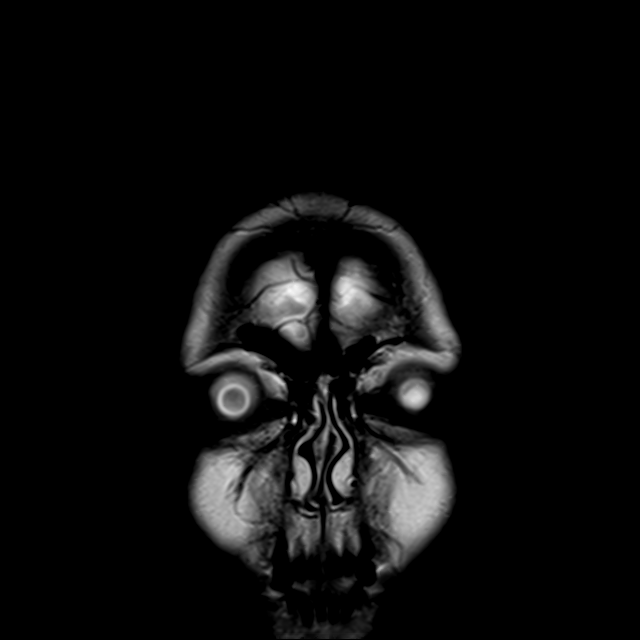

[41 of 48 positions shown; findings below may reference images not displayed]

FINDINGS: MRI HEAD FINDINGS

Brain: Mild diffuse prominence of the CSF containing spaces
compatible with generalized cerebral atrophy, most pronounced at the
anterior temporal lobes bilaterally. Single 7 mm focus of FLAIR
hyperintensity noted within the periventricular white matter of the
left frontal corona radiata (series 11, image 15), nonspecific, but
of doubtful significance in isolation.

No abnormal foci of restricted diffusion to suggest acute or
subacute ischemia. Gray-white matter differentiation maintained. No
encephalomalacia to suggest chronic cortical infarction. No evidence
for acute or chronic intracranial hemorrhage.

No mass lesion, midline shift or mass effect. No hydrocephalus or
extra-axial fluid collection. Pituitary gland suprasellar region
within normal limits. Midline structures intact.

Vascular: Major intracranial vascular flow voids are well
maintained.

Skull and upper cervical spine: Craniocervical junction normal. Bone
marrow signal intensity within normal limits. No scalp soft tissue
abnormality.

Sinuses/Orbits: Globes and orbital soft tissues demonstrate no acute
finding. Patient status post ocular lens replacement on the left.
Mild scattered mucosal thickening noted within the ethmoidal air
cells and maxillary sinuses. No air-fluid levels to suggest acute
sinusitis. Mastoid air cells are largely clear. Inner ear structures
grossly normal.

Other: None.

MRA HEAD FINDINGS

ANTERIOR CIRCULATION:

Visualized distal cervical segments of the internal carotid arteries
are widely patent with antegrade flow. Petrous, cavernous, and
supraclinoid segments widely patent without stenosis or other
abnormality. A1 segments widely patent. Normal anterior
communicating artery complex. Anterior cerebral arteries patent to
their distal aspects without stenosis. No M1 stenosis or occlusion.
Distal MCA branches well perfused and symmetric.

POSTERIOR CIRCULATION:

Left V4 segment dominant and widely patent to the vertebrobasilar
junction. Hypoplastic right V4 segment largely terminates in PICA,
although a tiny branch ascending towards the vertebrobasilar
junction. Both PICA origins patent and normal. Basilar diminutive
but patent to its distal aspect without stenosis. Superior
cerebellar arteries patent bilaterally. Fetal type origin of the
left PCA. Right PCA supplied via a hypoplastic right P1 segment and
robust right posterior communicating artery. Both PCAs well perfused
or distal aspects without stenosis.

No intracranial aneurysm or other vascular abnormality.

MRA NECK FINDINGS

AORTIC ARCH: Visualized portions of the aortic arch normal caliber
with normal branch pattern. No hemodynamically significant stenosis
seen about the origin of the great vessels.

RIGHT CAROTID SYSTEM: Right common carotid artery widely patent to
the bifurcation without stenosis. No significant atheromatous
narrowing about the right carotid bifurcation. Right ICA widely
patent distally without stenosis, evidence for dissection or
occlusion.

LEFT CAROTID SYSTEM: Left CCA patent from its origin to the
bifurcation without stenosis. No significant atheromatous narrowing
about the left bifurcation. Left ICA patent distally to the skull
base without stenosis, evidence for dissection or occlusion.

VERTEBRAL ARTERIES: Both vertebral arteries arise from subclavian
arteries. Left vertebral artery dominant. Visualized vertebral
arteries widely patent with antegrade flow, with no stenosis,
evidence for dissection or occlusion.
IMPRESSION: 1. Normal brain MRI for age.  No acute intracranial abnormality.
2. Normal intracranial MRA.
3. Normal MRA of the neck.

## 2021-02-13 IMAGING — MR MR MRA NECK W/O CM
2 series · 48 of 48 positions shown · non-contrast
Comparison: None available.

CLINICAL DATA: Initial evaluation for ongoing dizziness, numbness,
tingling, stroke suspected.



[Series 7: [id number] (better) · axial · 0.8mm · 0.78mm/px · z∈[-84,+93]mm · 41 of 222 slices shown]
[im 1/222]
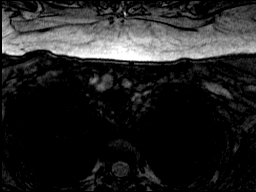
[im 6/222]
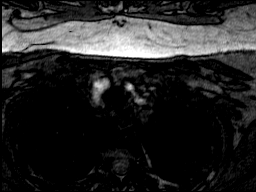
[im 12/222]
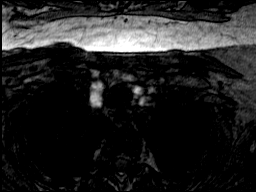
[im 17/222]
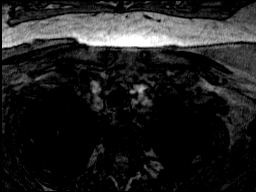
[im 23/222]
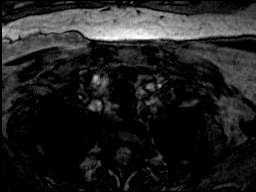
[im 28/222]
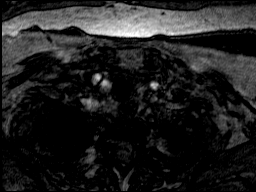
[im 34/222]
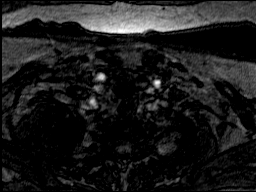
[im 39/222]
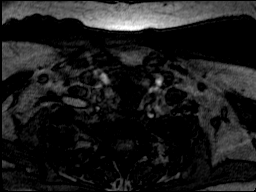
[im 45/222]
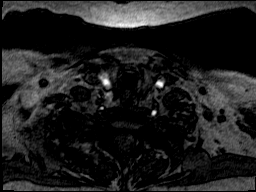
[im 50/222]
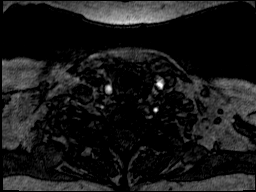
[im 56/222]
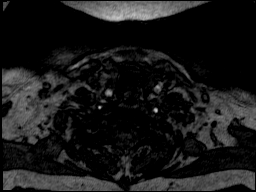
[im 61/222]
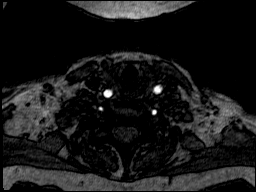
[im 67/222]
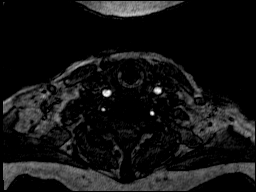
[im 72/222]
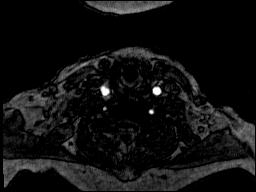
[im 78/222]
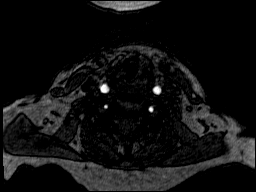
[im 83/222]
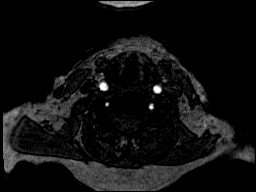
[im 89/222]
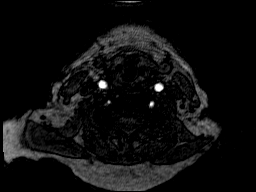
[im 94/222]
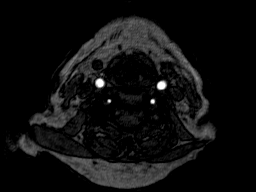
[im 100/222]
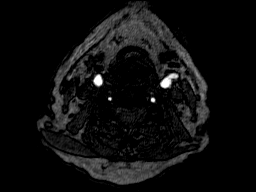
[im 105/222]
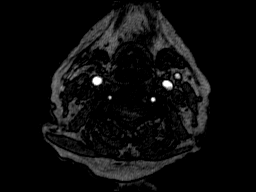
[im 111/222]
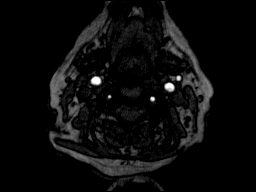
[im 117/222]
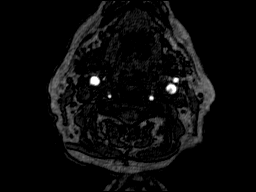
[im 122/222]
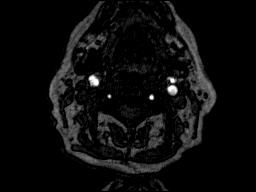
[im 128/222]
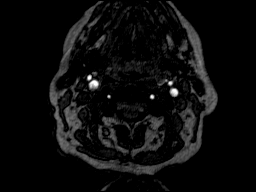
[im 133/222]
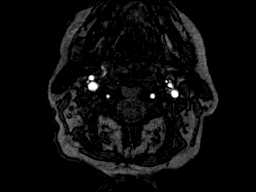
[im 139/222]
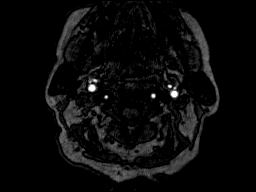
[im 144/222]
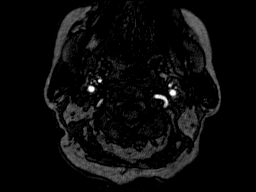
[im 150/222]
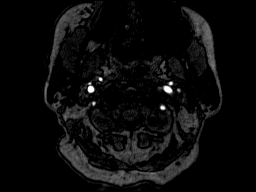
[im 155/222]
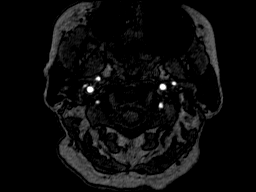
[im 161/222]
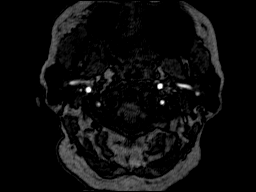
[im 166/222]
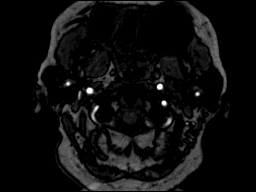
[im 172/222]
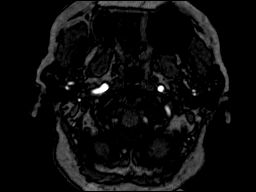
[im 177/222]
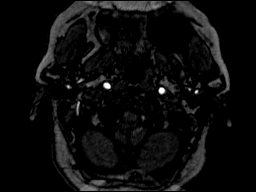
[im 183/222]
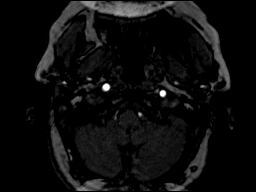
[im 188/222]
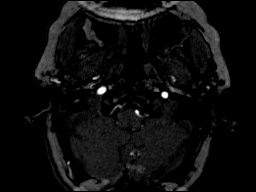
[im 194/222]
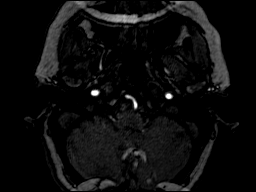
[im 199/222]
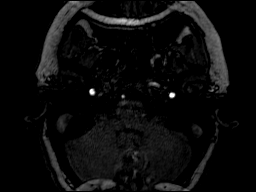
[im 205/222]
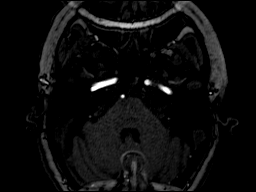
[im 210/222]
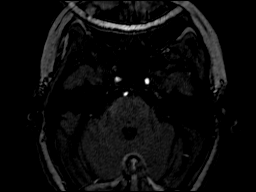
[im 216/222]
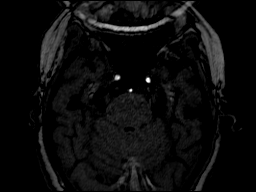
[im 222/222]
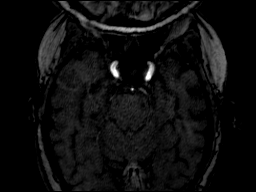

[Series 10: T1 fat-sat · axial · 4.0mm · 0.72mm/px · z∈[-111,+84]mm · 7 of 40 slices shown]
[im 1/40]
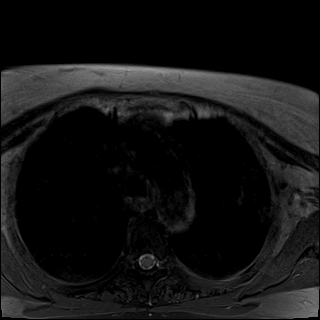
[im 7/40]
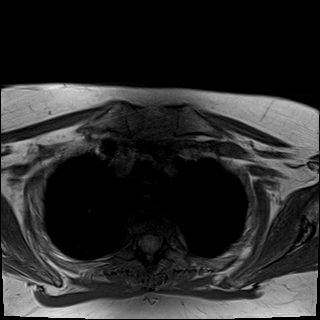
[im 14/40]
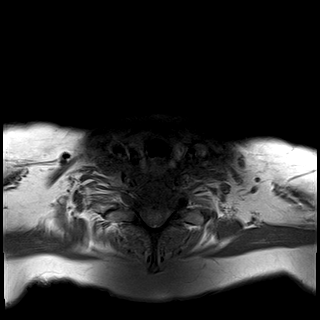
[im 20/40]
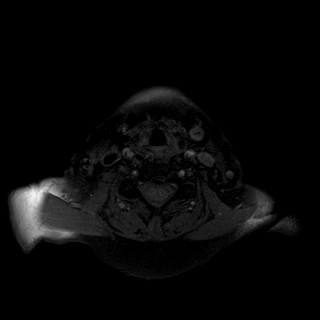
[im 27/40]
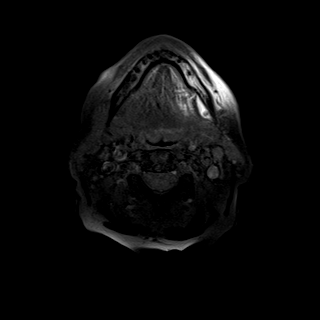
[im 33/40]
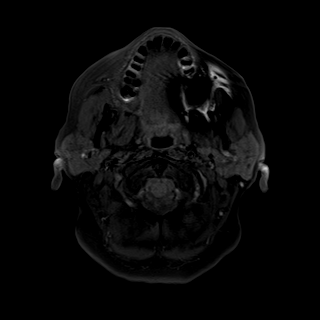
[im 40/40]
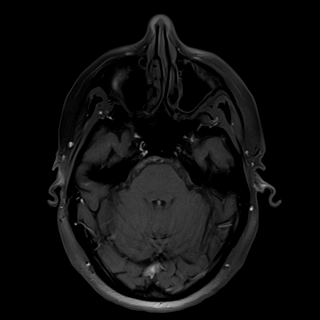

[48 of 48 positions shown; findings below may reference images not displayed]

FINDINGS: MRI HEAD FINDINGS

Brain: Mild diffuse prominence of the CSF containing spaces
compatible with generalized cerebral atrophy, most pronounced at the
anterior temporal lobes bilaterally. Single 7 mm focus of FLAIR
hyperintensity noted within the periventricular white matter of the
left frontal corona radiata (series 11, image 15), nonspecific, but
of doubtful significance in isolation.

No abnormal foci of restricted diffusion to suggest acute or
subacute ischemia. Gray-white matter differentiation maintained. No
encephalomalacia to suggest chronic cortical infarction. No evidence
for acute or chronic intracranial hemorrhage.

No mass lesion, midline shift or mass effect. No hydrocephalus or
extra-axial fluid collection. Pituitary gland suprasellar region
within normal limits. Midline structures intact.

Vascular: Major intracranial vascular flow voids are well
maintained.

Skull and upper cervical spine: Craniocervical junction normal. Bone
marrow signal intensity within normal limits. No scalp soft tissue
abnormality.

Sinuses/Orbits: Globes and orbital soft tissues demonstrate no acute
finding. Patient status post ocular lens replacement on the left.
Mild scattered mucosal thickening noted within the ethmoidal air
cells and maxillary sinuses. No air-fluid levels to suggest acute
sinusitis. Mastoid air cells are largely clear. Inner ear structures
grossly normal.

Other: None.

MRA HEAD FINDINGS

ANTERIOR CIRCULATION:

Visualized distal cervical segments of the internal carotid arteries
are widely patent with antegrade flow. Petrous, cavernous, and
supraclinoid segments widely patent without stenosis or other
abnormality. A1 segments widely patent. Normal anterior
communicating artery complex. Anterior cerebral arteries patent to
their distal aspects without stenosis. No M1 stenosis or occlusion.
Distal MCA branches well perfused and symmetric.

POSTERIOR CIRCULATION:

Left V4 segment dominant and widely patent to the vertebrobasilar
junction. Hypoplastic right V4 segment largely terminates in PICA,
although a tiny branch ascending towards the vertebrobasilar
junction. Both PICA origins patent and normal. Basilar diminutive
but patent to its distal aspect without stenosis. Superior
cerebellar arteries patent bilaterally. Fetal type origin of the
left PCA. Right PCA supplied via a hypoplastic right P1 segment and
robust right posterior communicating artery. Both PCAs well perfused
or distal aspects without stenosis.

No intracranial aneurysm or other vascular abnormality.

MRA NECK FINDINGS

AORTIC ARCH: Visualized portions of the aortic arch normal caliber
with normal branch pattern. No hemodynamically significant stenosis
seen about the origin of the great vessels.

RIGHT CAROTID SYSTEM: Right common carotid artery widely patent to
the bifurcation without stenosis. No significant atheromatous
narrowing about the right carotid bifurcation. Right ICA widely
patent distally without stenosis, evidence for dissection or
occlusion.

LEFT CAROTID SYSTEM: Left CCA patent from its origin to the
bifurcation without stenosis. No significant atheromatous narrowing
about the left bifurcation. Left ICA patent distally to the skull
base without stenosis, evidence for dissection or occlusion.

VERTEBRAL ARTERIES: Both vertebral arteries arise from subclavian
arteries. Left vertebral artery dominant. Visualized vertebral
arteries widely patent with antegrade flow, with no stenosis,
evidence for dissection or occlusion.
IMPRESSION: 1. Normal brain MRI for age.  No acute intracranial abnormality.
2. Normal intracranial MRA.
3. Normal MRA of the neck.

## 2021-02-13 NOTE — ED Notes (Signed)
Patient transported to MRI 

## 2021-02-13 NOTE — Discharge Instructions (Signed)
Please contact your primary care doctor for close follow-up.  We have placed in referrals to both neurology clinic and cardiology clinic.  They should be reaching out to you with appointments.  Return to the emergency department if any worsening or concerning symptoms.

## 2021-02-13 NOTE — ED Provider Notes (Signed)
[facility] EMERGENCY DEPARTMENT Provider Note   CSN: [id number] Arrival date & time: 02/13/21  1824     History Chief Complaint  Patient presents with  . Dizziness    Cathy Andrews is a 80 y.o. female.  She is here for evaluation of 2 weeks of intermittent dizzy lightheaded spells.  They are associated with some tingling on the right side of her head sometimes radiates down the right side of her body.  She was here 2 weeks ago for a syncopal event while at her primary care doctor's office.  She sometimes has these episodes when standing and sometimes when sitting.  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  She just had an MRI today although they do not find the results of these.  After that MRI she was at home when she experienced another dizzy spell with numbness on the right side of her body and she felt like her face was twitching  The history is provided by the patient.  Dizziness Quality:  Lightheadedness and head spinning Severity:  Moderate Onset quality:  Sudden Timing:  Intermittent Progression:  Unchanged Chronicity:  New Relieved by:  Nothing Worsened by:  Nothing Ineffective treatments:  None tried Associated symptoms: headaches, palpitations and syncope   Associated symptoms: no blood in stool, no chest pain, no hearing loss, no shortness of breath, no tinnitus, no vision changes, no vomiting and no weakness        No past medical history on file.  There are no problems to display for this patient.   Past Surgical History:  Procedure Laterality Date  . TONSILLECTOMY    . URETER SURGERY       OB History   No obstetric history on file.     Family History  Problem Relation Age of Onset  . Breast cancer Neg Hx     Social History   Tobacco Use  . Smoking status: Former Smoker    Types: Cigarettes  . Smokeless tobacco: Never Used  Vaping Use  . Vaping Use: Never used   Substance Use Topics  . Alcohol use: Yes    Alcohol/week: 7.0 standard drinks    Types: 7 Glasses of wine per week    Comment: usually 1 glass of red wine daily, occasionally 1 more on weekend    Home Medications Prior to Admission medications   Not on File    Allergies    Sulfa antibiotics  Review of Systems   Review of Systems  Constitutional: Negative for fever.  HENT: Negative for hearing loss, sore throat and tinnitus.   Eyes: Negative for visual disturbance.  Respiratory: Negative for shortness of breath.   Cardiovascular: Positive for palpitations and syncope. Negative for chest pain.  Gastrointestinal: Negative for abdominal pain, blood in stool and vomiting.  Genitourinary: Negative for dysuria.  Musculoskeletal: Negative for neck pain.  Skin: Negative for rash.  Neurological: Positive for dizziness, tremors, syncope, numbness and headaches. Negative for weakness.    Physical Exam Updated Vital Signs BP (!) 178/81 (BP Location: Right Arm)   Pulse 61   Temp 98.6 F (37 C) (Oral)   Resp 16   SpO2 100%   Physical Exam Vitals and nursing note reviewed.  Constitutional:      General: She is not in acute distress.    Appearance: Normal appearance. She is well-developed.  HENT:     Head: Normocephalic and atraumatic.  Eyes:     Conjunctiva/sclera: Conjunctivae normal.  Cardiovascular:     Rate and Rhythm: Normal rate and regular rhythm.     Heart sounds: No murmur heard.   Pulmonary:     Effort: Pulmonary effort is normal. No respiratory distress.     Breath sounds: Normal breath sounds.  Abdominal:     Palpations: Abdomen is soft.     Tenderness: There is no abdominal tenderness.  Musculoskeletal:        General: No deformity or signs of injury. Normal range of motion.     Cervical back: Neck supple.  Skin:    General: Skin is warm and dry.     Capillary Refill: Capillary refill takes less than 2 seconds.  Neurological:     General: No focal  deficit present.     Mental Status: She is alert and oriented to person, place, and time.     Cranial Nerves: No cranial nerve deficit.     Sensory: No sensory deficit.     Motor: No weakness.     Coordination: Coordination normal.     ED Results / Procedures / Treatments   Labs (all labs ordered are listed, but only abnormal results are displayed) Labs Reviewed  BASIC METABOLIC PANEL - Abnormal; Notable for the following components:      Result Value   Glucose, Bld 101 (*)    All other components within normal limits  CBC  MAGNESIUM  URINALYSIS, ROUTINE W REFLEX MICROSCOPIC    EKG EKG Interpretation  Date/Time:  Wednesday February 13 2021 18:31:28 EDT Ventricular Rate:  78 PR Interval:  200 QRS Duration: 116 QT Interval:  394 QTC Calculation: 449 R Axis:   -39 Text Interpretation: Sinus rhythm with occasional Premature ventricular complexes Left axis deviation Left ventricular hypertrophy with QRS widening and repolarization abnormality ( R in aVL , Cornell product ) Cannot rule out Septal infarct , age undetermined Abnormal ECG increased PVC from prior 3/22 Confirmed by Meridee Score [phone]) on 02/13/2021 7:13:44 PM   Radiology MR ANGIO HEAD WO CONTRAST  Result Date: 02/13/2021 CLINICAL DATA:  Initial evaluation for ongoing dizziness, numbness, tingling, stroke suspected. EXAM: MRI HEAD WITHOUT CONTRAST MRA HEAD WITHOUT CONTRAST MRA NECK WITHOUT CONTRAST TECHNIQUE: Multiplanar, multiecho pulse sequences of the brain and surrounding structures were obtained without intravenous contrast. Angiographic images of the Circle of Willis were obtained using MRA technique without intravenous contrast. Angiographic images of the neck were obtained using MRA technique without intravenous contrast. Carotid stenosis measurements (when applicable) are obtained utilizing NASCET criteria, using the distal internal carotid diameter as the denominator. COMPARISON:  None available. FINDINGS: MRI  HEAD FINDINGS Brain: Mild diffuse prominence of the CSF containing spaces compatible with generalized cerebral atrophy, most pronounced at the anterior temporal lobes bilaterally. Single 7 mm focus of FLAIR hyperintensity noted within the periventricular white matter of the left frontal corona radiata (series 11, image 15), nonspecific, but of doubtful significance in isolation. No abnormal foci of restricted diffusion to suggest acute or subacute ischemia. Gray-white matter differentiation maintained. No encephalomalacia to suggest chronic cortical infarction. No evidence for acute or chronic intracranial hemorrhage. No mass lesion, midline shift or mass effect. No hydrocephalus or extra-axial fluid collection. Pituitary gland suprasellar region within normal limits. Midline structures intact. Vascular: Major intracranial vascular flow voids are well maintained. Skull and upper cervical spine: Craniocervical junction normal. Bone marrow signal intensity within normal limits. No scalp soft tissue abnormality. Sinuses/Orbits: Globes and orbital soft tissues demonstrate no  acute finding. Patient status post ocular lens replacement on the left. Mild scattered mucosal thickening noted within the ethmoidal air cells and maxillary sinuses. No air-fluid levels to suggest acute sinusitis. Mastoid air cells are largely clear. Inner ear structures grossly normal. Other: None. MRA HEAD FINDINGS ANTERIOR CIRCULATION: Visualized distal cervical segments of the internal carotid arteries are widely patent with antegrade flow. Petrous, cavernous, and supraclinoid segments widely patent without stenosis or other abnormality. A1 segments widely patent. Normal anterior communicating artery complex. Anterior cerebral arteries patent to their distal aspects without stenosis. No M1 stenosis or occlusion. Distal MCA branches well perfused and symmetric. POSTERIOR CIRCULATION: Left V4 segment dominant and widely patent to the  vertebrobasilar junction. Hypoplastic right V4 segment largely terminates in PICA, although a tiny branch ascending towards the vertebrobasilar junction. Both PICA origins patent and normal. Basilar diminutive but patent to its distal aspect without stenosis. Superior cerebellar arteries patent bilaterally. Fetal type origin of the left PCA. Right PCA supplied via a hypoplastic right P1 segment and robust right posterior communicating artery. Both PCAs well perfused or distal aspects without stenosis. No intracranial aneurysm or other vascular abnormality. MRA NECK FINDINGS AORTIC ARCH: Visualized portions of the aortic arch normal caliber with normal branch pattern. No hemodynamically significant stenosis seen about the origin of the great vessels. RIGHT CAROTID SYSTEM: Right common carotid artery widely patent to the bifurcation without stenosis. No significant atheromatous narrowing about the right carotid bifurcation. Right ICA widely patent distally without stenosis, evidence for dissection or occlusion. LEFT CAROTID SYSTEM: Left CCA patent from its origin to the bifurcation without stenosis. No significant atheromatous narrowing about the left bifurcation. Left ICA patent distally to the skull base without stenosis, evidence for dissection or occlusion. VERTEBRAL ARTERIES: Both vertebral arteries arise from subclavian arteries. Left vertebral artery dominant. Visualized vertebral arteries widely patent with antegrade flow, with no stenosis, evidence for dissection or occlusion. IMPRESSION: 1. Normal brain MRI for age.  No acute intracranial abnormality. 2. Normal intracranial MRA. 3. Normal MRA of the neck. Electronically Signed   By: Rise Mu M.D.   On: 02/13/2021 22:07   MR ANGIO NECK WO CONTRAST  Result Date: 02/13/2021 CLINICAL DATA:  Initial evaluation for ongoing dizziness, numbness, tingling, stroke suspected. EXAM: MRI HEAD WITHOUT CONTRAST MRA HEAD WITHOUT CONTRAST MRA NECK WITHOUT  CONTRAST TECHNIQUE: Multiplanar, multiecho pulse sequences of the brain and surrounding structures were obtained without intravenous contrast. Angiographic images of the Circle of Willis were obtained using MRA technique without intravenous contrast. Angiographic images of the neck were obtained using MRA technique without intravenous contrast. Carotid stenosis measurements (when applicable) are obtained utilizing NASCET criteria, using the distal internal carotid diameter as the denominator. COMPARISON:  None available. FINDINGS: MRI HEAD FINDINGS Brain: Mild diffuse prominence of the CSF containing spaces compatible with generalized cerebral atrophy, most pronounced at the anterior temporal lobes bilaterally. Single 7 mm focus of FLAIR hyperintensity noted within the periventricular white matter of the left frontal corona radiata (series 11, image 15), nonspecific, but of doubtful significance in isolation. No abnormal foci of restricted diffusion to suggest acute or subacute ischemia. Gray-white matter differentiation maintained. No encephalomalacia to suggest chronic cortical infarction. No evidence for acute or chronic intracranial hemorrhage. No mass lesion, midline shift or mass effect. No hydrocephalus or extra-axial fluid collection. Pituitary gland suprasellar region within normal limits. Midline structures intact. Vascular: Major intracranial vascular flow voids are well maintained. Skull and upper cervical spine: Craniocervical junction normal. Bone marrow signal intensity within  normal limits. No scalp soft tissue abnormality. Sinuses/Orbits: Globes and orbital soft tissues demonstrate no acute finding. Patient status post ocular lens replacement on the left. Mild scattered mucosal thickening noted within the ethmoidal air cells and maxillary sinuses. No air-fluid levels to suggest acute sinusitis. Mastoid air cells are largely clear. Inner ear structures grossly normal. Other: None. MRA HEAD FINDINGS  ANTERIOR CIRCULATION: Visualized distal cervical segments of the internal carotid arteries are widely patent with antegrade flow. Petrous, cavernous, and supraclinoid segments widely patent without stenosis or other abnormality. A1 segments widely patent. Normal anterior communicating artery complex. Anterior cerebral arteries patent to their distal aspects without stenosis. No M1 stenosis or occlusion. Distal MCA branches well perfused and symmetric. POSTERIOR CIRCULATION: Left V4 segment dominant and widely patent to the vertebrobasilar junction. Hypoplastic right V4 segment largely terminates in PICA, although a tiny branch ascending towards the vertebrobasilar junction. Both PICA origins patent and normal. Basilar diminutive but patent to its distal aspect without stenosis. Superior cerebellar arteries patent bilaterally. Fetal type origin of the left PCA. Right PCA supplied via a hypoplastic right P1 segment and robust right posterior communicating artery. Both PCAs well perfused or distal aspects without stenosis. No intracranial aneurysm or other vascular abnormality. MRA NECK FINDINGS AORTIC ARCH: Visualized portions of the aortic arch normal caliber with normal branch pattern. No hemodynamically significant stenosis seen about the origin of the great vessels. RIGHT CAROTID SYSTEM: Right common carotid artery widely patent to the bifurcation without stenosis. No significant atheromatous narrowing about the right carotid bifurcation. Right ICA widely patent distally without stenosis, evidence for dissection or occlusion. LEFT CAROTID SYSTEM: Left CCA patent from its origin to the bifurcation without stenosis. No significant atheromatous narrowing about the left bifurcation. Left ICA patent distally to the skull base without stenosis, evidence for dissection or occlusion. VERTEBRAL ARTERIES: Both vertebral arteries arise from subclavian arteries. Left vertebral artery dominant. Visualized vertebral arteries  widely patent with antegrade flow, with no stenosis, evidence for dissection or occlusion. IMPRESSION: 1. Normal brain MRI for age.  No acute intracranial abnormality. 2. Normal intracranial MRA. 3. Normal MRA of the neck. Electronically Signed   By: Rise Mu M.D.   On: 02/13/2021 22:07   MR BRAIN WO CONTRAST  Result Date: 02/13/2021 CLINICAL DATA:  Initial evaluation for ongoing dizziness, numbness, tingling, stroke suspected. EXAM: MRI HEAD WITHOUT CONTRAST MRA HEAD WITHOUT CONTRAST MRA NECK WITHOUT CONTRAST TECHNIQUE: Multiplanar, multiecho pulse sequences of the brain and surrounding structures were obtained without intravenous contrast. Angiographic images of the Circle of Willis were obtained using MRA technique without intravenous contrast. Angiographic images of the neck were obtained using MRA technique without intravenous contrast. Carotid stenosis measurements (when applicable) are obtained utilizing NASCET criteria, using the distal internal carotid diameter as the denominator. COMPARISON:  None available. FINDINGS: MRI HEAD FINDINGS Brain: Mild diffuse prominence of the CSF containing spaces compatible with generalized cerebral atrophy, most pronounced at the anterior temporal lobes bilaterally. Single 7 mm focus of FLAIR hyperintensity noted within the periventricular white matter of the left frontal corona radiata (series 11, image 15), nonspecific, but of doubtful significance in isolation. No abnormal foci of restricted diffusion to suggest acute or subacute ischemia. Gray-white matter differentiation maintained. No encephalomalacia to suggest chronic cortical infarction. No evidence for acute or chronic intracranial hemorrhage. No mass lesion, midline shift or mass effect. No hydrocephalus or extra-axial fluid collection. Pituitary gland suprasellar region within normal limits. Midline structures intact. Vascular: Major intracranial vascular flow voids are well  maintained. Skull and  upper cervical spine: Craniocervical junction normal. Bone marrow signal intensity within normal limits. No scalp soft tissue abnormality. Sinuses/Orbits: Globes and orbital soft tissues demonstrate no acute finding. Patient status post ocular lens replacement on the left. Mild scattered mucosal thickening noted within the ethmoidal air cells and maxillary sinuses. No air-fluid levels to suggest acute sinusitis. Mastoid air cells are largely clear. Inner ear structures grossly normal. Other: None. MRA HEAD FINDINGS ANTERIOR CIRCULATION: Visualized distal cervical segments of the internal carotid arteries are widely patent with antegrade flow. Petrous, cavernous, and supraclinoid segments widely patent without stenosis or other abnormality. A1 segments widely patent. Normal anterior communicating artery complex. Anterior cerebral arteries patent to their distal aspects without stenosis. No M1 stenosis or occlusion. Distal MCA branches well perfused and symmetric. POSTERIOR CIRCULATION: Left V4 segment dominant and widely patent to the vertebrobasilar junction. Hypoplastic right V4 segment largely terminates in PICA, although a tiny branch ascending towards the vertebrobasilar junction. Both PICA origins patent and normal. Basilar diminutive but patent to its distal aspect without stenosis. Superior cerebellar arteries patent bilaterally. Fetal type origin of the left PCA. Right PCA supplied via a hypoplastic right P1 segment and robust right posterior communicating artery. Both PCAs well perfused or distal aspects without stenosis. No intracranial aneurysm or other vascular abnormality. MRA NECK FINDINGS AORTIC ARCH: Visualized portions of the aortic arch normal caliber with normal branch pattern. No hemodynamically significant stenosis seen about the origin of the great vessels. RIGHT CAROTID SYSTEM: Right common carotid artery widely patent to the bifurcation without stenosis. No significant atheromatous narrowing  about the right carotid bifurcation. Right ICA widely patent distally without stenosis, evidence for dissection or occlusion. LEFT CAROTID SYSTEM: Left CCA patent from its origin to the bifurcation without stenosis. No significant atheromatous narrowing about the left bifurcation. Left ICA patent distally to the skull base without stenosis, evidence for dissection or occlusion. VERTEBRAL ARTERIES: Both vertebral arteries arise from subclavian arteries. Left vertebral artery dominant. Visualized vertebral arteries widely patent with antegrade flow, with no stenosis, evidence for dissection or occlusion. IMPRESSION: 1. Normal brain MRI for age.  No acute intracranial abnormality. 2. Normal intracranial MRA. 3. Normal MRA of the neck. Electronically Signed   By: Rise Mu M.D.   On: 02/13/2021 22:07    Procedures Procedures   Medications Ordered in ED Medications - No data to display  ED Course  I have reviewed the triage vital signs and the nursing notes.  Pertinent labs & imaging results that were available during my care of the patient were reviewed by me and considered in my medical decision making (see chart for details).  Clinical Course as of 02/13/21 2319  Wed Feb 13, 2021  2024 Patient evaluated by neurology Dr. Selina Cooley.  She is recommending an MRI MRA and an EEG.  If these are unremarkable patient can be discharged for outpatient follow-up. [MB]  2315 The EEG was having mechanical issues.  It does not sound like they can do the EEG for another hour or 2.  I relayed this to the patient and they would rather just be discharged with [place] an outpatient.  I informed Dr. Selina Cooley of this. [MB]  2315 I have put in referrals to neurology and cardiology for the patient. [MB]    Clinical Course User Index [MB] Terrilee Files, MD   MDM Rules/Calculators/A&P  This patient complains of dizziness lightheadedness, tingling on the right side of her body, twitching; this  involves an extensive number of treatment Options and is a complaint that carries with it a high risk of complications and Morbidity. The differential includes stroke, seizure, metabolic derangement, arrhythmia, anemia  I ordered, reviewed and interpreted labs, which included CBC with normal white count normal hemoglobin, chemistries normal, magnesium normal  I ordered imaging studies which included MRI/MRA brain and neck and I independently    visualized and interpreted imaging which showed no acute findings Additional history obtained from patient's husband Previous records obtained and reviewed in epic including prior syncopal event a few weeks ago I consulted neurology Dr. Selina CooleyStack and discussed lab and imaging findings  Critical Interventions: None  After the interventions stated above, I reevaluated the patient and found patient to be currently asymptomatic.  She is having frequent PVCs so recommended for cardiology follow-up.  Also she will need an outpatient EEG and further neurology work-up so have placed a referral into neurology.  Return instructions discussed.   Final Clinical Impression(s) / ED Diagnoses Final diagnoses:  Dizziness  PVC (premature ventricular contraction)    Rx / DC Orders ED Discharge Orders         Ordered    Ambulatory referral to Cardiology        02/13/21 2236    Ambulatory referral to Neurology       Comments: An appointment is requested in approximately: 1week   02/13/21 2236           Terrilee FilesButler, Hagen Tidd C, MD 02/13/21 2321

## 2021-02-13 NOTE — ED Notes (Signed)
MD at bedside. 

## 2021-02-13 NOTE — ED Triage Notes (Signed)
Pt arrives POV for eval of ongoing dizziness, numbness, and tingling, sometimes on the right side, and sometimes all over her body, x 2-3 weeks. Today pt had a dizzy spell at noon while eating lunch. After she took a nap, she felt like the R side of her head "doesn't feel good"-describes as tingling and pain, so went to PCP who advised her to come to ED.

## 2021-02-13 NOTE — ED Notes (Signed)
Pt is very agitated and states she is going to put on her shoes and walk out. MD notified.

## 2021-02-20 ENCOUNTER — Encounter: Payer: Self-pay | Admitting: Neurology

## 2021-02-20 ENCOUNTER — Other Ambulatory Visit: Payer: Self-pay

## 2021-02-20 ENCOUNTER — Ambulatory Visit: Payer: Medicare PPO | Admitting: Neurology

## 2021-02-20 VITALS — BP 136/74 | HR 67 | Ht 62.0 in | Wt 149.0 lb

## 2021-02-20 DIAGNOSIS — R202 Paresthesia of skin: Secondary | ICD-10-CM

## 2021-02-20 DIAGNOSIS — R42 Dizziness and giddiness: Secondary | ICD-10-CM

## 2021-02-20 NOTE — Progress Notes (Signed)
Subjective:    Patient ID: Cathy Andrews is a 80 y.o. female.  HPI     Cathy Foley, MD, PhD [facility] [address] [address] [place], [place] [address]  I saw patient, Cathy Andrews, as a referral from the emergency room for evaluation of dizziness.  The patient is accompanied by her husband today.  Cathy Andrews is a 54 year old right-handed woman with a benign medical history, who reports intermittent right facial tingling.  Symptoms started about 3 weeks ago.  She has had intermittent lightheadedness, and had 1 syncopal spell at her eye doctors office some 3 weeks ago.  She was evaluated in the emergency room on 01/30/2021.  I reviewed the emergency room records.  She was found to be mildly confused.  Work-up otherwise showed nonfocal exam, troponin negative, EKG reassuring.  Rapid urine drug screen was positive for THC.  She reports that she took a THC gummy in the morning around 8 AM.  She had not done this before.  She had purchased the [facility] Gummies in [place] in November 2021 and both she and her husband had taken a half gummy at the time.  She had not used it again until that morning.  She felt off balance.  She has since then not taken any.  She reports intermittent tingling sensation in the right forehead and face, no sudden onset of one-sided weakness or numbness or tingling or droopy face or severe headache.  Sometimes she has tingling in the right arm and leg.  She has intermittently felt lightheaded but has not passed out since then.  A week ago she had worsening of her lightheadedness.  She went to her PCP office to the walk-in clinic and saw another provider.  Stroke was suspected or needed to be ruled out and she was advised to go to the emergency room via EMS.  They decided to go by themselves.   She presented to the emergency room on 02/13/2021 with a complaint of dizziness.  She felt lightheaded, she had had symptoms for 2 weeks.  She reported a  recent syncopal event.  I reviewed the emergency room records.  She had a brain MRI without contrast on 02/13/2021, as well as MR angiogram of the head and neck and I reviewed the results:  IMPRESSION: 1. Normal brain MRI for age.  No acute intracranial abnormality. 2. Normal intracranial MRA. 3. Normal MRA of the neck.  An EEG was planned through the emergency room but due to mechanical issues they could not pursue it at the time. Blood work in the emergency room showed sodium of 136, potassium 3.8, glucose 101, BUN 18, creatinine 0.67, CBC showed WBC of 7, hemoglobin 13.2, hematocrit 40.8, normal platelets.  Magnesium was normal at 2.1.  Troponin was normal at 4.  She quit smoking many years ago, about 40 years ago.  She drinks alcohol daily in the form of wine, about 6 ounces per day.  She drinks water, 8 ounce bottles, about 4/day on average.  She drinks 1 cup of coffee in the morning and often soda with lunch.   Her Past Medical History Is Significant For: History reviewed. No pertinent past medical history.  Her Past Surgical History Is Significant For: Past Surgical History:  Procedure Laterality Date  . TONSILLECTOMY    . URETER SURGERY      Her Family History Is Significant For: Family History  Problem Relation Age of Onset  . Alzheimer's disease Mother   .  Breast cancer Neg Hx     Her Social History Is Significant For: Social History   Socioeconomic History  . Marital status: Married    Spouse name: Not on file  . Number of children: Not on file  . Years of education: Not on file  . Highest education level: Not on file  Occupational History  . Not on file  Tobacco Use  . Smoking status: Former Smoker    Types: Cigarettes  . Smokeless tobacco: Never Used  Vaping Use  . Vaping Use: Never used  Substance and Sexual Activity  . Alcohol use: Yes    Alcohol/week: 7.0 standard drinks    Types: 7 Glasses of wine per week    Comment: usually 1 glass of red wine daily,  occasionally 1 more on weekend  . Drug use: Not on file    Comment: THC gummies very rarely, x2 in her lifetime  . Sexual activity: Not on file  Other Topics Concern  . Not on file  Social History Narrative  . Not on file   Social Determinants of Health   Financial Resource Strain: Not on file  Food Insecurity: Not on file  Transportation Needs: Not on file  Physical Activity: Not on file  Stress: Not on file  Social Connections: Not on file    Her Allergies Are:  Allergies  Allergen Reactions  . Sulfa Antibiotics Other (See Comments)  :   Her Current Medications Are:  Outpatient Encounter Medications as of 02/20/2021  Medication Sig  . CALCIUM PO 1 tablet with meals  . Cyanocobalamin (VITAMIN B-12 PO) 1 tablet  . MELATONIN PO   . Multiple Vitamin (MULTIVITAMIN PO) multivitamin  . Omega-3 Fatty Acids (FISH OIL PO) 1 capsule  . Red Yeast Rice Extract (RED YEAST RICE PO) 2 tablets   No facility-administered encounter medications on file as of 02/20/2021.  :   Review of Systems:  Out of a complete 14 point review of systems, all are reviewed and negative with the exception of these symptoms as listed below:  Review of Systems  Neurological:       Here to discuss worsening dizziness and tingling primarily on the right side. Pt report sx started to be more noticeable in march of 2022. Pt reports being off balance and 2 ER visits over in March and April due to her sx.    Pt has been in been a participant of drug trials in [place] for Alzheimer, wanted md to be aware.    Objective:  Neurological Exam  Physical Exam Physical Examination:   Vitals:   02/20/21 1137  BP: 136/74  Pulse: 67  SpO2: 97%   On orthostatic testing, she has no significant lightheadedness or vertiginous symptoms.  Standing blood pressure 134/82 with a pulse of 69.  General Examination: The patient is a very pleasant 80 y.o. female in no acute distress. She appears well-developed and  well-nourished and well groomed.   HEENT: Normocephalic, atraumatic, pupils are equal, round and reactive to light, extraocular tracking is preserved, face is symmetric with normal facial animation and normal facial sensation to light touch, pinprick, temperature and vibration, hearing is grossly intact to tuning fork.  Speech is clear without dysarthria, hypophonia or voice tremor.  Airway examination reveals mild mouth dryness, tongue protrudes centrally and palate elevates symmetrically.  No carotid bruits.  Neck is supple with full range of motion noted.  Chest: Clear to auscultation without wheezing, rhonchi or crackles noted.  Heart: S1+S2+0, regular  and normal without murmurs, rubs or gallops noted.   Abdomen: Soft, non-tender and non-distended with normal bowel sounds appreciated on auscultation.  Extremities: There is no pitting edema in the distal lower extremities bilaterally.   Skin: Warm and dry without trophic changes noted.  Musculoskeletal: exam reveals no obvious joint deformities, tenderness or joint swelling or erythema.   Neurologically:  Mental status: The patient is awake, alert and oriented in all 4 spheres. Her immediate and remote memory, attention, language skills and fund of knowledge are appropriate. There is no evidence of aphasia, agnosia, apraxia or anomia. Speech is clear with normal prosody and enunciation. Thought process is linear. Mood is normal and affect is normal.  Cranial nerves II - XII are as described above under HEENT exam. In addition: shoulder shrug is normal with equal shoulder height noted. Motor exam: Normal bulk, strength and tone is noted. There is no drift, tremor or rebound. Reflexes are 2+ throughout. Babinski: Toes are flexor bilaterally. Fine motor skills and coordination: intact with normal finger taps, normal hand movements, normal rapid alternating patting, normal foot taps and normal foot agility.  Cerebellar testing: No dysmetria or  intention tremor on finger to nose testing. Heel to shin is unremarkable bilaterally. There is no truncal or gait ataxia.  Sensory exam: intact to light touch, pinprick, vibration, temperature sense in the upper and lower extremities.  Gait, station and balance: She stands easily. No veering to one side is noted. No leaning to one side is noted. Posture is age-appropriate and stance is narrow based. Gait shows normal stride length and normal pace. No problems turning are noted.  No shuffling, preserved arm swing.    Assessment and Plan:   In summary, Cathy Andrews is a very pleasant 80 y.o.-year old female with a benign medical history, who presents for evaluation of her intermittent dizziness and intermittent right facial tingling.  History and examination are not telltale for vertigo or orthostatic hypotension.  Exam is nonfocal.  History is not suggestive of TIA or stroke or seizures.  Nevertheless, she had recent ER visits.  An EEG was planned on 02/13/2021 but could not be done through the ER at the time.  We will go ahead and schedule an EEG through this office because of the episodic nature of her symptoms.  She is largely reassured today.  She is advised to stay better hydrated with water, 6 to 8 cups/day are recommended and she is cautioned against the daily consumption of wine.  She was already cautioned regarding the use of THC in the emergency room on 01/30/2021.   She is advised to follow-up with her primary care physician as scheduled.  We will call her with her EEG results and plan a follow-up in this office accordingly.  As long as the EEG suggests normal findings, we can consider a follow-up in this office on an as-needed basis.  I answered all their questions today and the patient and her husband were in agreement.    Cathy Foley, MD, PhD

## 2021-02-20 NOTE — Patient Instructions (Addendum)
I am not quite sure how to explain your tingling in the face.  Your blood pressure does not drop significantly when you change positions.  Your neurological exam is benign.  Please follow-up with your primary care physician on a regular basis.  Please try to hydrate better with water, 6 to 8 cups of water per day are generally recommended, 8 ounce size each.  Please limit your alcohol intake to less than 1 serving of wine per day (4 oz glass).   We will proceed with an EEG (brainwave test), which we will schedule. We will call you with the results.  So long as the EEG is benign, we can see you in follow-up in this office as needed.  Should you have sudden changes in your symptoms, including sudden onset of one sided weakness, numbness, tingling, slurring of speech or droopy face, hearing loss, ringing in your ears, double vision or partial or complete blindness and/or severe headaches, you have to seek medical attention immediately, that is, call 911 or go to the emergency room.

## 2021-02-21 DIAGNOSIS — H524 Presbyopia: Secondary | ICD-10-CM | POA: Diagnosis not present

## 2021-02-21 DIAGNOSIS — H2513 Age-related nuclear cataract, bilateral: Secondary | ICD-10-CM | POA: Diagnosis not present

## 2021-02-21 DIAGNOSIS — H353110 Nonexudative age-related macular degeneration, right eye, stage unspecified: Secondary | ICD-10-CM | POA: Diagnosis not present

## 2021-02-27 DIAGNOSIS — Z87891 Personal history of nicotine dependence: Secondary | ICD-10-CM | POA: Insufficient documentation

## 2021-03-11 DIAGNOSIS — R55 Syncope and collapse: Secondary | ICD-10-CM | POA: Diagnosis not present

## 2021-03-11 DIAGNOSIS — Z87891 Personal history of nicotine dependence: Secondary | ICD-10-CM | POA: Diagnosis not present

## 2021-03-11 DIAGNOSIS — R9431 Abnormal electrocardiogram [ECG] [EKG]: Secondary | ICD-10-CM | POA: Diagnosis not present

## 2021-03-12 DIAGNOSIS — I517 Cardiomegaly: Secondary | ICD-10-CM | POA: Diagnosis not present

## 2021-03-13 ENCOUNTER — Other Ambulatory Visit: Payer: Medicare PPO

## 2021-03-27 DIAGNOSIS — M79645 Pain in left finger(s): Secondary | ICD-10-CM | POA: Diagnosis not present

## 2021-03-27 DIAGNOSIS — M65332 Trigger finger, left middle finger: Secondary | ICD-10-CM | POA: Diagnosis not present

## 2021-03-28 DIAGNOSIS — R9431 Abnormal electrocardiogram [ECG] [EKG]: Secondary | ICD-10-CM | POA: Diagnosis not present

## 2021-03-28 DIAGNOSIS — R55 Syncope and collapse: Secondary | ICD-10-CM | POA: Diagnosis not present

## 2021-03-29 DIAGNOSIS — R9431 Abnormal electrocardiogram [ECG] [EKG]: Secondary | ICD-10-CM | POA: Diagnosis not present

## 2021-03-29 DIAGNOSIS — R55 Syncope and collapse: Secondary | ICD-10-CM | POA: Diagnosis not present

## 2021-04-03 ENCOUNTER — Ambulatory Visit: Payer: Medicare PPO | Admitting: Neurology

## 2021-04-03 DIAGNOSIS — R202 Paresthesia of skin: Secondary | ICD-10-CM

## 2021-04-03 DIAGNOSIS — R55 Syncope and collapse: Secondary | ICD-10-CM | POA: Diagnosis not present

## 2021-04-03 DIAGNOSIS — R42 Dizziness and giddiness: Secondary | ICD-10-CM

## 2021-04-04 NOTE — Progress Notes (Signed)
Please call and advise the patient that the EEG or brain wave test we performed was reported as normal in the awake state. We checked for abnormal electrical discharges in the brain waves and the report suggested normal findings. No further action is required on this test at this time.  At this juncture, as discussed, she can follow-up with her primary care. Thanks,  [name], MD, PhD

## 2021-04-04 NOTE — Procedures (Signed)
   HISTORY: 80 year old female, complains of intermittent dizziness, passing out spells TECHNIQUE:  This is a routine 16 channel EEG recording with one channel devoted to a limited EKG recording.  It was performed during wakefulness, drowsiness and asleep.  Hyperventilation and photic stimulation were performed as activating procedures.  There are minimum muscle and movement artifact noted.  Upon maximum arousal, posterior dominant waking rhythm consistent of rhythmic alpha range activity, with frequency of 9 hz. Activities are symmetric over the bilateral posterior derivations and attenuated with eye opening.  Hyperventilation produced mild/moderate buildup with higher amplitude and the slower activities noted.  Photic stimulation did not alter the tracing.  During EEG recording, patient developed drowsiness and no deeper stage of sleep was achieved  During EEG recording, there was no epileptiform discharge noted.  EKG demonstrate sinus rhythm, with heart rate of 56 bpm  CONCLUSION: This is a  normal awake EEG.  There is no electrodiagnostic evidence of epileptiform discharge.  Levert Feinstein, M.D. Ph.D.  [facility] [address] [place], [place] [address] Phone: [phone] Fax:      [phone]

## 2021-04-08 ENCOUNTER — Telehealth: Payer: Self-pay

## 2021-04-08 NOTE — Telephone Encounter (Signed)
-----   Message from [name], MD sent at 04/04/2021  4:51 PM EDT ----- Please call and advise the patient that the EEG or brain wave test we performed was reported as normal in the awake state. We checked for abnormal electrical discharges in the brain waves and the report suggested normal findings. No further action is required on this test at this time.  At this juncture, as discussed, she can follow-up with her primary care. Thanks,  [name], MD, PhD

## 2021-04-08 NOTE — Telephone Encounter (Signed)
I called pt. No answer, left a message asking pt to call me back.   

## 2021-04-09 NOTE — Telephone Encounter (Signed)
Pt has called Megan, RN back please call. 

## 2021-04-09 NOTE — Telephone Encounter (Signed)
I called the pt and we discussed results. She verbalized understanding and will f/u with PCP as directed. Copy of report sent to PCP.

## 2021-04-10 DIAGNOSIS — R55 Syncope and collapse: Secondary | ICD-10-CM | POA: Diagnosis not present

## 2021-04-10 DIAGNOSIS — R9431 Abnormal electrocardiogram [ECG] [EKG]: Secondary | ICD-10-CM | POA: Diagnosis not present

## 2021-04-12 ENCOUNTER — Ambulatory Visit: Payer: Medicare PPO | Admitting: Interventional Cardiology

## 2021-04-17 DIAGNOSIS — R9431 Abnormal electrocardiogram [ECG] [EKG]: Secondary | ICD-10-CM | POA: Diagnosis not present

## 2021-04-17 DIAGNOSIS — Z87891 Personal history of nicotine dependence: Secondary | ICD-10-CM | POA: Diagnosis not present

## 2021-04-17 DIAGNOSIS — R55 Syncope and collapse: Secondary | ICD-10-CM | POA: Diagnosis not present

## 2021-04-17 NOTE — Patient Instructions (Addendum)
It was a pleasure to see you today!  I have ordered a bone density scan. Please call the [facility] to the [facility] to schedule an appointment at your convenience. Overall I think you are doing very well for 80 years old. Keep up the walking and good work. If you have any concerns you are welcome to follow up at any time, otherwise we should see each other once a year for an annual physical.   Be Well,  Dr. Leary Roca

## 2021-04-17 NOTE — Progress Notes (Signed)
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 Seen in ED in March. Follows with cardiology, had abnormal rhythm monitor with PVCs and normal echo. Cardiologist (Dr. Desma Maxim, [facility]) recommended starting diltiazem 120 mg cd, and potassium and magnesium supplementation.   COVID-19 vaccine: patient had 3rd shot in October 2021 and 4th dose in March 2022  General Healthcare: Medication Compliance: yes  Cardiac Risk:  patient 80 yo, no recent lipid panel or CMP, but out of age for screening/ starting treatment with statin. Since it will not change management, will not order labs today. Counseled on dietary behaviors for optimal cardiac health.  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 They have an advanced directive. Recommended they find this paperwork at home and review it as well as bring it in at their next appt and can scan into chart.   Cancer:  Colorectal >> Colonoscopy: yes, 2015 with 2 polyps, no further repeats recommended Lung >> Tobacco Use: not significant enough to warrant screen  - If so, previous Low-Dose CT screen: n/a  Breast >> Mammogram: last done in 2020, previous screens all WNL. Patient does not wish to continue screening, which is appropriate given age and history.  Cervical/Endometrial >>  - Postmenopausal: yes - Hysterectomy: no  - Vaginal Bleeding: no - Pap Smear: yes   - Previous Abnormal Pap: no, all previously normal Skin >> Suspicious lesions: no   Other: Osteoporosis: no, but osteopenia noted in care everywhere. Unsure of last DEXA. Will order DEXA.  Zoster Vaccine: yes last dose in 2019, believes she has had 3 doses total. Flu Vaccine: no, outside of flu season  Pneumonia Vaccine: yes, patient believes she had 2 doses of vaccination in 2020   ROS- no problems noted today  Objective: BP (!) 123/44   Pulse 72   Ht 5\' 2"  (1.575 m)   Wt 150 lb 6 oz (68.2 kg)   SpO2 99%   BMI 27.50 kg/m  Gen: age-appropriate WW,NAD, alert, cooperative with exam, WNWD HEENT: NCAT, EOMI, PERRL, clear oropharynx CV: RRR, good S1/S2, no murmur Resp:  CTABL, no wheezes, non-labored Abd: Soft, Non Tender, Non Distended, BS present, no guarding or organomegaly Genital Exam: not done Ext: No edema, warm, 2+ PD Neuro: Alert and oriented, No gross deficits   Assessment/Plan:  Healthcare maintenance Patient has h/o osteopenia, no recent DEXA. Ordered DEXA.  Syncope Patient with 2 episodes of syncope. Followed by cardiology, normal echo 04/10/21, abnormal patch monitor  significant for PVCs, thought to be cause of symptoms. Patient started on diltiazem 120 cd, started today. Recommend continued exercise as tolerated (walking).   Shirlean Mylar, MD, PGY-2 04/18/2021 4:49 PM

## 2021-04-18 ENCOUNTER — Other Ambulatory Visit: Payer: Self-pay

## 2021-04-18 ENCOUNTER — Encounter: Payer: Self-pay | Admitting: Family Medicine

## 2021-04-18 ENCOUNTER — Ambulatory Visit: Payer: Medicare PPO | Admitting: Family Medicine

## 2021-04-18 VITALS — BP 123/44 | HR 72 | Ht 62.0 in | Wt 150.4 lb

## 2021-04-18 DIAGNOSIS — Z0000 Encounter for general adult medical examination without abnormal findings: Secondary | ICD-10-CM

## 2021-04-18 DIAGNOSIS — E2839 Other primary ovarian failure: Secondary | ICD-10-CM | POA: Diagnosis not present

## 2021-04-18 DIAGNOSIS — R55 Syncope and collapse: Secondary | ICD-10-CM | POA: Diagnosis not present

## 2021-04-18 NOTE — Assessment & Plan Note (Signed)
Patient has h/o osteopenia, no recent DEXA. Ordered DEXA.

## 2021-04-18 NOTE — Assessment & Plan Note (Signed)
Patient with 2 episodes of syncope. Followed by cardiology, normal echo 04/10/21, abnormal patch monitor significant for PVCs, thought to be cause of symptoms. Patient started on diltiazem 120 cd, started today. Recommend continued exercise as tolerated (walking).

## 2021-04-26 ENCOUNTER — Other Ambulatory Visit: Payer: Self-pay | Admitting: Family Medicine

## 2021-04-26 DIAGNOSIS — E2839 Other primary ovarian failure: Secondary | ICD-10-CM

## 2021-05-07 ENCOUNTER — Other Ambulatory Visit: Payer: Medicare PPO

## 2021-05-08 DIAGNOSIS — M65332 Trigger finger, left middle finger: Secondary | ICD-10-CM | POA: Diagnosis not present

## 2021-05-08 DIAGNOSIS — M79645 Pain in left finger(s): Secondary | ICD-10-CM | POA: Diagnosis not present

## 2021-05-28 ENCOUNTER — Ambulatory Visit: Payer: Medicare PPO

## 2021-05-30 DIAGNOSIS — M65332 Trigger finger, left middle finger: Secondary | ICD-10-CM | POA: Diagnosis not present

## 2021-05-30 DIAGNOSIS — Z4789 Encounter for other orthopedic aftercare: Secondary | ICD-10-CM | POA: Diagnosis not present

## 2021-06-03 ENCOUNTER — Telehealth: Payer: Self-pay | Admitting: Family Medicine

## 2021-06-03 NOTE — Telephone Encounter (Signed)
Called patient to schedule AWV, LVM for patient to call back to schedule if patient calls back. PLEASE let me know and I can either schedule it and let you know or come get me and I will help out due to some changes in the scheduling. Thanks!

## 2021-06-13 DIAGNOSIS — M79645 Pain in left finger(s): Secondary | ICD-10-CM | POA: Diagnosis not present

## 2021-06-24 ENCOUNTER — Telehealth: Payer: Self-pay

## 2021-06-24 NOTE — Telephone Encounter (Signed)
Placed call to pt to schedule AWV. Lvm

## 2021-07-22 DIAGNOSIS — Z87891 Personal history of nicotine dependence: Secondary | ICD-10-CM | POA: Diagnosis not present

## 2021-07-22 DIAGNOSIS — Z8249 Family history of ischemic heart disease and other diseases of the circulatory system: Secondary | ICD-10-CM | POA: Diagnosis not present

## 2021-07-22 DIAGNOSIS — Z9181 History of falling: Secondary | ICD-10-CM | POA: Diagnosis not present

## 2021-07-22 DIAGNOSIS — Z6825 Body mass index (BMI) 25.0-25.9, adult: Secondary | ICD-10-CM | POA: Diagnosis not present

## 2021-07-22 DIAGNOSIS — Z809 Family history of malignant neoplasm, unspecified: Secondary | ICD-10-CM | POA: Diagnosis not present

## 2021-07-22 DIAGNOSIS — I499 Cardiac arrhythmia, unspecified: Secondary | ICD-10-CM | POA: Diagnosis not present

## 2021-07-22 DIAGNOSIS — I10 Essential (primary) hypertension: Secondary | ICD-10-CM | POA: Diagnosis not present

## 2021-07-22 DIAGNOSIS — E663 Overweight: Secondary | ICD-10-CM | POA: Diagnosis not present

## 2021-08-07 DIAGNOSIS — R9431 Abnormal electrocardiogram [ECG] [EKG]: Secondary | ICD-10-CM | POA: Diagnosis not present

## 2021-08-07 DIAGNOSIS — Z87891 Personal history of nicotine dependence: Secondary | ICD-10-CM | POA: Diagnosis not present

## 2021-08-07 DIAGNOSIS — R55 Syncope and collapse: Secondary | ICD-10-CM | POA: Diagnosis not present

## 2021-08-19 DIAGNOSIS — H43822 Vitreomacular adhesion, left eye: Secondary | ICD-10-CM | POA: Diagnosis not present

## 2021-08-19 DIAGNOSIS — H5213 Myopia, bilateral: Secondary | ICD-10-CM | POA: Diagnosis not present

## 2021-08-19 DIAGNOSIS — H524 Presbyopia: Secondary | ICD-10-CM | POA: Diagnosis not present

## 2021-08-19 DIAGNOSIS — H52223 Regular astigmatism, bilateral: Secondary | ICD-10-CM | POA: Diagnosis not present

## 2021-08-19 DIAGNOSIS — H35372 Puckering of macula, left eye: Secondary | ICD-10-CM | POA: Diagnosis not present

## 2021-10-09 ENCOUNTER — Ambulatory Visit
Admission: RE | Admit: 2021-10-09 | Discharge: 2021-10-09 | Disposition: A | Discharge status: Home / Self Care | Payer: Medicare PPO | Source: Ambulatory Visit | Attending: Family Medicine | Admitting: Family Medicine

## 2021-10-09 DIAGNOSIS — M8589 Other specified disorders of bone density and structure, multiple sites: Secondary | ICD-10-CM | POA: Diagnosis not present

## 2021-10-09 DIAGNOSIS — Z780 Asymptomatic menopausal state: Secondary | ICD-10-CM | POA: Diagnosis not present

## 2021-10-09 DIAGNOSIS — E2839 Other primary ovarian failure: Secondary | ICD-10-CM

## 2021-12-17 DIAGNOSIS — H269 Unspecified cataract: Secondary | ICD-10-CM | POA: Diagnosis not present

## 2021-12-17 DIAGNOSIS — Z87891 Personal history of nicotine dependence: Secondary | ICD-10-CM | POA: Diagnosis not present

## 2021-12-17 DIAGNOSIS — I2510 Atherosclerotic heart disease of native coronary artery without angina pectoris: Secondary | ICD-10-CM | POA: Diagnosis not present

## 2021-12-17 DIAGNOSIS — Z882 Allergy status to sulfonamides status: Secondary | ICD-10-CM | POA: Diagnosis not present

## 2021-12-17 DIAGNOSIS — Z8673 Personal history of transient ischemic attack (TIA), and cerebral infarction without residual deficits: Secondary | ICD-10-CM | POA: Diagnosis not present

## 2021-12-17 DIAGNOSIS — R030 Elevated blood-pressure reading, without diagnosis of hypertension: Secondary | ICD-10-CM | POA: Diagnosis not present

## 2022-02-11 DIAGNOSIS — Z961 Presence of intraocular lens: Secondary | ICD-10-CM | POA: Diagnosis not present

## 2022-02-11 DIAGNOSIS — H3530 Unspecified macular degeneration: Secondary | ICD-10-CM | POA: Diagnosis not present

## 2022-02-11 DIAGNOSIS — H5213 Myopia, bilateral: Secondary | ICD-10-CM | POA: Diagnosis not present

## 2022-02-11 DIAGNOSIS — Z9842 Cataract extraction status, left eye: Secondary | ICD-10-CM | POA: Diagnosis not present

## 2022-02-11 DIAGNOSIS — H524 Presbyopia: Secondary | ICD-10-CM | POA: Diagnosis not present

## 2022-02-11 DIAGNOSIS — H52223 Regular astigmatism, bilateral: Secondary | ICD-10-CM | POA: Diagnosis not present

## 2022-02-11 DIAGNOSIS — H353131 Nonexudative age-related macular degeneration, bilateral, early dry stage: Secondary | ICD-10-CM | POA: Diagnosis not present

## 2022-02-11 DIAGNOSIS — H2511 Age-related nuclear cataract, right eye: Secondary | ICD-10-CM | POA: Diagnosis not present

## 2022-02-11 DIAGNOSIS — H1789 Other corneal scars and opacities: Secondary | ICD-10-CM | POA: Diagnosis not present

## 2022-03-19 ENCOUNTER — Telehealth: Payer: Self-pay | Admitting: Family Medicine

## 2022-03-19 ENCOUNTER — Ambulatory Visit: Payer: Medicare PPO | Admitting: Family Medicine

## 2022-03-19 ENCOUNTER — Encounter: Payer: Self-pay | Admitting: Family Medicine

## 2022-03-19 VITALS — BP 139/60 | HR 60 | Ht 62.0 in | Wt 134.6 lb

## 2022-03-19 DIAGNOSIS — R55 Syncope and collapse: Secondary | ICD-10-CM | POA: Diagnosis not present

## 2022-03-19 DIAGNOSIS — I10 Essential (primary) hypertension: Secondary | ICD-10-CM | POA: Diagnosis not present

## 2022-03-19 DIAGNOSIS — Z23 Encounter for immunization: Secondary | ICD-10-CM

## 2022-03-19 NOTE — Assessment & Plan Note (Signed)
Patient tolerating diltiazem well- no further dizziness or syncope episodes. Recommend follow up with Dr. Nida Boatman, cardiology, in August 2023 as he recommended. ?

## 2022-03-19 NOTE — Assessment & Plan Note (Signed)
Chronic, well controlled. No change. 130s-140s is acceptable range for patient in her 43s given that DBP is 60 and concern for falls if DBP much lower.  ?

## 2022-03-19 NOTE — Patient Instructions (Signed)
It was a pleasure to see you today! ? ?You are up to date with your COVID shots ?Follow up with Dr. Almyra Deforest for diltiazem. Make an appt in August 2023 ?Your next Dexa scan (bone density) will be due in December 2024. ?Keep up the good work with exercising, Cathy Andrews is the best way to protect you from bone density loss (osteoporosis) ?You will get your pneumonia vaccine today ? ?Be Well, ? ?Dr. Leary Roca ? ?

## 2022-03-19 NOTE — Progress Notes (Signed)
    SUBJECTIVE:   Chief compliant/HPI: annual examination  Cathy Andrews is a 81 y.o. who presents today for an annual exam.   Hypertension: 139/60, this is an acceptable range for patient. On diltiazem 120 mg qd. Patient has h/o syncope, see below. Do not want to drop DBP below 60.  Osteopenia: Dexa scan in 12/22 showed T score of -1.6, c/w osteopenia. Patient is taking Ca-Vit D, exercises ath the [employer] on [address] and twice daily walking. Dexa scan next due December 2024.  Syncope: Patient was having dizziness and syncope, worked up with neurology and cardiology. Syncope thought to be cardiogenic, she had PVCs and 9 beats of atrial tachycardia. She was started on diltiazem 120 mg qd by cardiology, Dr. Nida Boatman, reports no dizziness or syncope since then.  History tabs reviewed and updated.   Review of systems form reviewed and not notable.   OBJECTIVE:   BP 139/60   Pulse 60   Ht $R'5\' 2"'FS$  (1.575 m)   Wt 134 lb 9.6 oz (61.1 kg)   SpO2 100%   BMI 24.62 kg/m   Nursing note and vitals reviewed GEN: age-appropriate, WW, resting comfortably in chair, NAD, WNWD HEENT: NCAT. PERRLA. Sclera without injection or icterus. MMM.  Neck: Supple. No LAD. Cardiac: Regular rate and rhythm. Normal S1/S2. No murmurs, rubs, or gallops appreciated. 2+ radial pulses. Lungs: Clear bilaterally to ascultation. No increased WOB, no accessory muscle usage. No w/r/r. Neuro: AOx3  Ext: no edema Psych: Pleasant and appropriate   ASSESSMENT/PLAN:   Syncope Patient tolerating diltiazem well- no further dizziness or syncope episodes. Recommend follow up with Dr. Nida Boatman, cardiology, in August 2023 as he recommended.  Hypertension Chronic, well controlled. No change. 130s-140s is acceptable range for patient in her 57s given that DBP is 60 and concern for falls if DBP much lower.    Annual Examination  See AVS for age appropriate recommendations  PHQ score 0, reviewed and discussed.  BP reviewed and at  goal.  Asked about intimate partner violence and resources given as appropriate- not needed Advance directives discussion- has one per husband  Considered the following items based upon USPSTF recommendations: Diabetes screening:  not indicated Screening for elevated cholesterol:  not indicated HIV testing:  low risk, not indicated Hepatitis C:  low risk not indicated Hepatitis B:  low risk not indicated Syphilis if at high risk:  not at high risk GC/CT not at high risk and not ordered. Osteoporosis screening considered based upon risk of fracture from [facility] calculator. Last Dexa was T score -1.6. Patient engages in weight bearing exercise multiple times per week, eats protein, takes Ca-Vit D. Recheck of DEXA due 10/2023. Reviewed risk factors for latent tuberculosis and not indicated   Discussed family history, BRCA testing not indicated.  Cervical cancer screening:  outside of screening age Breast cancer screening:  outside of screening age Colorectal cancer screening: up to date on screening for CRC.  Lung cancer screening:  does not meet screening criteria . See documentation below regarding indications/risks/benefits.  Vaccinations - received pneumonia vaccine today.   Follow up in 1 year or sooner if indicated.    Gladys Damme, MD [place]

## 2022-03-20 NOTE — Telephone Encounter (Signed)
Opened in error. Please ignore.  Shirlean Mylar, MD [facility], PGY-3

## 2022-03-21 ENCOUNTER — Ambulatory Visit: Payer: Medicare PPO | Admitting: Student

## 2022-03-21 ENCOUNTER — Telehealth: Payer: Self-pay

## 2022-03-21 ENCOUNTER — Encounter: Payer: Self-pay | Admitting: Student

## 2022-03-21 VITALS — BP 147/55 | HR 57 | Ht 62.0 in | Wt 136.4 lb

## 2022-03-21 DIAGNOSIS — T50Z95A Adverse effect of other vaccines and biological substances, initial encounter: Secondary | ICD-10-CM | POA: Diagnosis not present

## 2022-03-21 DIAGNOSIS — L539 Erythematous condition, unspecified: Secondary | ICD-10-CM | POA: Diagnosis not present

## 2022-03-21 MED ORDER — CETIRIZINE HCL 10 MG PO TABS: 10.0000 mg | ORAL_TABLET | Freq: Every day | ORAL | 0 refills | Status: AC

## 2022-03-21 NOTE — Progress Notes (Signed)
  SUBJECTIVE:   CHIEF COMPLAINT / HPI:   Reaction to PNA vaccin on 5/17 Reaction happened within 1-1.5 hours from shot administration. Started to have itching, and pain. She took tylenol for the pain. Also used some ice to help with pain/warmth. Has been one large area, since she got it, doesn't seem to be progressing, and swelling is going down. Pain was described as achey and rated pain as 10/10. Redness is going away. Still very itchy. No other symptoms (nausea, vomiting, throat swelling, diarrhea)  PERTINENT  PMH / PSH: HTN   OBJECTIVE:  BP (!) 147/55   Pulse (!) 57   Ht 5\' 2"  (1.575 m)   Wt 136 lb 6.4 oz (61.9 kg)   SpO2 100%   BMI 24.95 kg/m   General: NAD, pleasant, able to participate in exam Cardiac: RRR, no murmurs auscultated. Respiratory: CTAB, normal effort, no wheezes, rales or rhonchi Abdomen: soft, non-tender, non-distended, normoactive bowel sounds Extremities: warmth and erethyma to well circumscribed area of left upper extremity about injection site, no swelling Psych: Normal affect and mood       ASSESSMENT/PLAN:  No problem-specific Assessment & Plan notes found for this encounter.   Hypersensitivity Reaction Patient appears to have had hypersensitivity reaction to PCV 20 vaccine. Rash with erythema, puritis, developed within hour of vaccine, but has since been resolving. Patient denied any issues with breathing, diarrhea, nausea or vomiting. Rash will likely continue to regress, but may persist for wks. -Given age of patient, Zyrtec chosen as antihistamine -Encouraged ice pack for itching/discomfort -OTC cortisol cream to help with redness and itching  No orders of the defined types were placed in this encounter.  Meds ordered this encounter  Medications   cetirizine (ZYRTEC) 10 MG tablet    Sig: Take 1 tablet (10 mg total) by mouth daily.    Dispense:  30 tablet    Refill:  0   No follow-ups on file. @SIGNNOTE @

## 2022-03-21 NOTE — Telephone Encounter (Signed)
Patient calls nurse line reporting adverse reaction to PNA 23 on 5/17.  Patient reports she received the vaccine that morning during office visit and by the afternoon her arm was red and swollen.   Patient reports the area is painful and warm to touch. Patient denies any fevers, body aches or chills.   Patient reports she has been using cold compresses with little success.   Patient scheduled for this afternoon for evaluation.

## 2022-03-21 NOTE — Patient Instructions (Addendum)
It was great to see you! Thank you for allowing me to participate in your care!  It looks like you had a Hyper Sensitivity reaction to the vaccine. This is different from an Allergic Reaction, and DOES NOT mean you should avoid getting vaccines in the future. This is an adverse reaction, and will be reported to the state.  Our plans for today:  - Continue to monitor - Ice as needed for itching and pain - Zyrtec for itching - Over the counter Cortisol cream for itching/redness  - Seek medical attention if:  Area starts to worsen in appearence or symptoms worsen  Take care and seek immediate care sooner if you develop any concerns.   Dr. Bess Kinds, MD [facility]

## 2022-03-24 DIAGNOSIS — Z961 Presence of intraocular lens: Secondary | ICD-10-CM | POA: Diagnosis not present

## 2022-03-24 DIAGNOSIS — H25011 Cortical age-related cataract, right eye: Secondary | ICD-10-CM | POA: Diagnosis not present

## 2022-03-24 DIAGNOSIS — H2511 Age-related nuclear cataract, right eye: Secondary | ICD-10-CM | POA: Diagnosis not present

## 2022-03-24 DIAGNOSIS — H353131 Nonexudative age-related macular degeneration, bilateral, early dry stage: Secondary | ICD-10-CM | POA: Diagnosis not present

## 2022-03-24 DIAGNOSIS — H25041 Posterior subcapsular polar age-related cataract, right eye: Secondary | ICD-10-CM | POA: Diagnosis not present

## 2022-03-26 ENCOUNTER — Encounter (INDEPENDENT_AMBULATORY_CARE_PROVIDER_SITE_OTHER): Payer: Medicare PPO | Admitting: Ophthalmology

## 2022-04-08 ENCOUNTER — Encounter (INDEPENDENT_AMBULATORY_CARE_PROVIDER_SITE_OTHER): Payer: Medicare PPO | Admitting: Ophthalmology

## 2022-04-08 DIAGNOSIS — H43813 Vitreous degeneration, bilateral: Secondary | ICD-10-CM | POA: Diagnosis not present

## 2022-04-08 DIAGNOSIS — H353132 Nonexudative age-related macular degeneration, bilateral, intermediate dry stage: Secondary | ICD-10-CM

## 2022-04-10 DIAGNOSIS — M79641 Pain in right hand: Secondary | ICD-10-CM | POA: Diagnosis not present

## 2022-04-10 DIAGNOSIS — M13849 Other specified arthritis, unspecified hand: Secondary | ICD-10-CM | POA: Diagnosis not present

## 2022-04-10 DIAGNOSIS — M79644 Pain in right finger(s): Secondary | ICD-10-CM | POA: Diagnosis not present

## 2022-04-10 DIAGNOSIS — M65351 Trigger finger, right little finger: Secondary | ICD-10-CM | POA: Diagnosis not present

## 2022-04-11 DIAGNOSIS — M19049 Primary osteoarthritis, unspecified hand: Secondary | ICD-10-CM | POA: Insufficient documentation

## 2022-04-11 DIAGNOSIS — R52 Pain, unspecified: Secondary | ICD-10-CM | POA: Insufficient documentation

## 2022-06-11 DIAGNOSIS — R9431 Abnormal electrocardiogram [ECG] [EKG]: Secondary | ICD-10-CM | POA: Diagnosis not present

## 2022-06-11 DIAGNOSIS — R55 Syncope and collapse: Secondary | ICD-10-CM | POA: Diagnosis not present

## 2022-06-11 DIAGNOSIS — Z87891 Personal history of nicotine dependence: Secondary | ICD-10-CM | POA: Diagnosis not present

## 2022-09-11 ENCOUNTER — Telehealth: Payer: Self-pay | Admitting: Neurology

## 2022-09-11 NOTE — Telephone Encounter (Signed)
I don't see where patient has been seen here at [facility] for memory trouble nor discussed this medication.

## 2022-09-11 NOTE — Telephone Encounter (Signed)
Pt said Aduhelm study has ended, want to know if neurologist can prescribe the Aduhelm

## 2022-09-15 NOTE — Telephone Encounter (Signed)
Unfortunately, we do not prescribe Aduhelm through this office.  Please let patient know. We have not seen her in this office for memory loss.  She would need a new referral.

## 2022-09-15 NOTE — Telephone Encounter (Signed)
Spoke with [facility] in referrals. She spoke with husband earlier when he came by the office. He understands a referral is needed and that we may not be able to prescribe the Aduhelm.

## 2022-10-08 NOTE — Telephone Encounter (Signed)
Patient came into the office Monday once again, with additional records and a disc copy of patients original PET scan. Again, none of these records are adequate to get patient scheduled for an appointment. I called and spoke with Cathy Andrews, the coordinator for the clinical trial. She sent me the records she was able to send which she stated would include reports of her MRI's but I did not receive them. I called and had to leave message on VM for patients husband Cathy Andrews, letting him know we will still need a referral for patient to get her scheduled. When I spoke with Shanda Bumps, she stated patient was in the trial starting in 2016-2020, that trial ended and they began an extension of the study 2021-2023. We will need info from primary care including a general recent workup for her and including notes regarding what symptoms she continues to have. Left my number for patient to c/b I can speak with husband if he does.

## 2022-10-09 DIAGNOSIS — Z1231 Encounter for screening mammogram for malignant neoplasm of breast: Secondary | ICD-10-CM | POA: Diagnosis not present

## 2022-10-09 DIAGNOSIS — Z6823 Body mass index (BMI) 23.0-23.9, adult: Secondary | ICD-10-CM | POA: Diagnosis not present

## 2022-10-09 DIAGNOSIS — Z01419 Encounter for gynecological examination (general) (routine) without abnormal findings: Secondary | ICD-10-CM | POA: Diagnosis not present

## 2022-10-16 ENCOUNTER — Encounter: Payer: Self-pay | Admitting: Student

## 2022-10-16 ENCOUNTER — Ambulatory Visit: Payer: Medicare PPO | Admitting: Student

## 2022-10-16 VITALS — BP 125/54 | HR 63 | Ht 62.0 in | Wt 127.2 lb

## 2022-10-16 DIAGNOSIS — F0280 Dementia in other diseases classified elsewhere without behavioral disturbance: Secondary | ICD-10-CM

## 2022-10-16 DIAGNOSIS — I493 Ventricular premature depolarization: Secondary | ICD-10-CM | POA: Diagnosis not present

## 2022-10-16 DIAGNOSIS — G309 Alzheimer's disease, unspecified: Secondary | ICD-10-CM | POA: Diagnosis not present

## 2022-10-16 NOTE — Telephone Encounter (Signed)
Received records from [name]- Clinical Trial records.  [facility].  To Dr. Frances Furbish for review.

## 2022-10-16 NOTE — Patient Instructions (Signed)
It was great to see you! Thank you for allowing me to participate in your care!   I recommend that you always bring your medications to each appointment as this makes it easy to ensure we are on the correct medications and helps [place] not miss when refills are needed.  Our plans for today:  - I have placed a referral to Dr. Marcello Moores with [facility] in [place] - I have placed a referral to Cardiology in [place] - If you do not hear from the Neurologist or Cardiologist by 11/03/2022, please call me - Please schedule your medicare annual wellness visit, may need to be done in January - If you do not have enough medication to last until your cardiologist visit, please call me  Take care and seek immediate care sooner if you develop any concerns. Please remember to show up 15 minutes before your scheduled appointment time!  Tiffany Kocher, DO [facility]

## 2022-10-16 NOTE — Progress Notes (Signed)
    SUBJECTIVE:   CHIEF COMPLAINT / HPI:   PVC Patient would like to be seen by local cardiologist. 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 Asymptomatic at this time, and well controlled on diltiazem without side-effects.  Dementia Patient reports being diagnosed with Alzheimer's dementia in a trial for Aducanumab, by the neurologist Dr. Luberta Robertson. Patient and spouse believe this medication has significantly improved the patient's life and wish to restart this medication. I discussed this medication was not one that I could prescribe. After discussion they named Dr. Marcello Moores as a neurologist who was part of this study. They have requested to be referred to Dr. Marcello Moores to inquire about restarting Aducanumab.   PERTINENT  PMH / PSH: HTN, PVC, Dementia  OBJECTIVE:   BP (!) 125/54   Pulse 63   Ht 5\' 2"  (1.575 m)   Wt 127 lb 4 oz (57.7 kg)   SpO2 99%   BMI 23.27 kg/m   Gen: NAD, well-appearing HENT: Normocephalic/atraumatic head, normal external ear and nose. EOM intact bilaterally, no scleral icterus.  Resp: CTAB, normal wob on RA Skin: Warm and dry Neuro: Alert, oriented and active  ASSESSMENT/PLAN:   Alzheimer's dementia (HCC) Reports a diagnosis of Alzheimer's dementia in 2022, after taking part in clinical trial for Aducanumab. Patient specifically requesting to see Dr. 2023, who was a neurologist they had seen in this trial.  -Referral placed to Dr. 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 Currently well controlled with Diltiazem. Patient is requesting referral to local cardiologist. -Continue Diltiazem -Referral for cardiology in [place], [place]   Follow-up recommendations for PCP 1.) Confirm COVID-19 boost 2.) Need MAW visit  [place], DO [facility]

## 2022-10-16 NOTE — Assessment & Plan Note (Signed)
Reports a diagnosis of Alzheimer's dementia in 2022, after taking part in clinical trial for Aducanumab. Patient specifically requesting to see Dr. Marcello Moores, who was a neurologist they had seen in this trial.  -Referral placed to Dr. Marcello Moores with [facility]

## 2022-10-16 NOTE — Assessment & Plan Note (Addendum)
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 Currently well controlled with Diltiazem. Patient is requesting referral to local cardiologist. -Continue Diltiazem -Referral for cardiology in [place], [place]

## 2022-10-24 ENCOUNTER — Ambulatory Visit: Payer: Medicare PPO | Admitting: Internal Medicine

## 2022-10-29 ENCOUNTER — Ambulatory Visit: Payer: Medicare PPO | Admitting: Family Medicine

## 2022-10-29 ENCOUNTER — Encounter: Payer: Self-pay | Admitting: Family Medicine

## 2022-10-29 VITALS — BP 120/70 | HR 65 | Wt 127.0 lb

## 2022-10-29 DIAGNOSIS — M542 Cervicalgia: Secondary | ICD-10-CM

## 2022-10-29 MED ORDER — DICLOFENAC SODIUM 1 % EX GEL: 4.0000 g | Freq: Four times a day (QID) | CUTANEOUS | 1 refills | Status: AC

## 2022-10-29 NOTE — Assessment & Plan Note (Signed)
-  suspicious for MSK etiology -cervical spine and right shoulder imaging ordered, handout provided -voltaren gel prescribed -conservative measures discussed including heating pad and stretching exercises -ED precautions discussed -follow up as appropriate

## 2022-10-29 NOTE — Patient Instructions (Signed)
It was great seeing you today!  Today we discussed your neck pain, we can get imaging to make sure there is not anything we are missing but this seems to be due to muscle pain. Use a heating pad and you may apply voltaren gel which I have prescribed.   Please get this imaging at [facility].   If you pass out or experience dizziness while moving your neck then please go to the emergency department.   Please follow up at your next scheduled appointment, if anything arises between now and then, please don't hesitate to contact our office.   Thank you for allowing [place] to be a part of your medical care!  Thank you, Dr. Robyne Peers  Also a reminder of our clinic's no-show policy. Please make sure to arrive at least 15 minutes prior to your scheduled appointment time. Please try to cancel before 24 hours if you are not able to make it. If you no-show for 2 appointments then you will be receiving a warning letter. If you no-show after 3 visits, then you may be at risk of being dismissed from our clinic. This is to ensure that everyone is able to be seen in a timely manner. Thank you, we appreciate your assistance with this!

## 2022-10-29 NOTE — Progress Notes (Signed)
    SUBJECTIVE:   CHIEF COMPLAINT / HPI:   Patient presents for shoulder and neck pain, she is accompanied by her husband. Follows with neurologist at [facility] for her dementia but has been seen for syncope in the past. A few days ago, she developed tingling on the right side of her face which has been intermittent since it started on Saturday. She cannot pinpoint a pattern to when it starts. Sometimes she is not even aware of it. It seems more in in chin. When she went to go reach for something, she noticed her neck and shoulder pain on the right side which started 6-8 months ago. But the pain improved and then restarted. Denies chest pain, dyspnea or injury.   OBJECTIVE:   BP 120/70   Pulse 65   Wt 127 lb (57.6 kg)   SpO2 100%   BMI 23.23 kg/m   General: Patient well-appearing, in no acute distress. CV: RRR, no murmurs or gallops auscultated Resp: CTAB, no wheezing, rales or rhonchi noted MSK: full active ROM of neck and UE bilaterally, no gross deformity or abnormality noted, no erythema or edema noted, tight tissue texture changes noted along cervical and upper thoracis right >left, negative Spurling's testing  Neuro: CN 2-12 grossly intact, gross sensation intact, 5/5 UE and LE strength bilaterally, normal gait  ASSESSMENT/PLAN:   Neck pain -suspicious for MSK etiology -cervical spine and right shoulder imaging ordered, handout provided -voltaren gel prescribed -conservative measures discussed including heating pad and stretching exercises -ED precautions discussed -follow up as appropriate     -PHQ-9 score of 0 reviewed.   Reece Leader, DO [facility]

## 2022-10-30 ENCOUNTER — Ambulatory Visit
Admission: RE | Admit: 2022-10-30 | Discharge: 2022-10-30 | Disposition: A | Discharge status: Home / Self Care | Payer: Medicare PPO | Source: Ambulatory Visit | Attending: Family Medicine | Admitting: Family Medicine

## 2022-10-30 DIAGNOSIS — M25511 Pain in right shoulder: Secondary | ICD-10-CM | POA: Diagnosis not present

## 2022-10-30 DIAGNOSIS — M542 Cervicalgia: Secondary | ICD-10-CM

## 2022-10-30 DIAGNOSIS — M4312 Spondylolisthesis, cervical region: Secondary | ICD-10-CM | POA: Diagnosis not present

## 2022-10-30 DIAGNOSIS — M50323 Other cervical disc degeneration at C6-C7 level: Secondary | ICD-10-CM | POA: Diagnosis not present

## 2022-10-30 DIAGNOSIS — M50322 Other cervical disc degeneration at C5-C6 level: Secondary | ICD-10-CM | POA: Diagnosis not present

## 2022-12-02 ENCOUNTER — Encounter: Payer: Self-pay | Admitting: Neurology

## 2022-12-02 ENCOUNTER — Ambulatory Visit: Payer: Medicare PPO | Admitting: Neurology

## 2022-12-02 VITALS — BP 124/62 | HR 61 | Ht 62.0 in | Wt 128.0 lb

## 2022-12-02 DIAGNOSIS — Z820 Family history of epilepsy and other diseases of the nervous system: Secondary | ICD-10-CM

## 2022-12-02 DIAGNOSIS — R55 Syncope and collapse: Secondary | ICD-10-CM

## 2022-12-02 DIAGNOSIS — R413 Other amnesia: Secondary | ICD-10-CM

## 2022-12-02 DIAGNOSIS — G309 Alzheimer's disease, unspecified: Secondary | ICD-10-CM

## 2022-12-02 NOTE — Progress Notes (Signed)
Subjective:    Patient ID: Cathy Andrews is a 82 y.o. female.  HPI    Star Age, MD, PhD [facility] [address], [address] [place], [place] [address]  Dear Drs. Nelda Bucks and [place],  I saw your patient, Cathy Andrews, upon your kind request in my neurologic clinic today for initial consultation of her memory loss.  The patient is accompanied by her husband today.  As you know, Cathy Andrews is an 82 year old female with an underlying medical history of PVCs, hypertension, neck pain, facial paresthesias for which I had evaluated her in 2022, and history of syncope, who reports an approximately 7-year history of memory loss including forgetfulness, as well as a family history of memory loss affecting her mom.  Mom lived to be 52 and died with Alzheimer's dementia.  She had confusion in her later years.  Dad lived to be 64.  She has 3 younger sisters none of whom had memory issues, 1 sister died of cancer in 2022-01-12. She participated in an Alzheimer's trial with Aduhelm from 2017 onwards, the trial was on hold for about a year and restarted in 2021.  As part of the trial she had scans including MRI and PET scan of the brain.  I do not have the reports of the brain MRI and PET scans.  These were primarily done to qualify the patient for the Alzheimer's trial. She reports 2 recent episodes of passing out, both happened when she was laying in bed and turned over.  She denies any warning signs, husband was there and did not notice any convulsion or twitching or foaming at the mouth, no bowel or bladder incontinence, she was out for about a minute.  She did not suddenly stand up.  She does report a low blood pressure typically at home and the blood pressure in the clinic today is on the lower side.   She still drives, she drives mostly locally such as to the [facility].  She goes to the gym 3 mornings a week.  She tries to hydrate well but estimates that she drinks less than  6 cups of water per day, she drinks 1 glass of red wine per day, about 8 ounces.  She drinks 1 cup of half-and-half coffee per day.  She has not fallen recently.  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 She quit smoking over 40 years ago. She had a previous brain MRI without contrast as well as MRA head without contrast and MRA neck without contrast on 02/13/2021 and I reviewed the results: IMPRESSION: 1. Normal brain MRI for age.  No acute intracranial abnormality. 2. Normal intracranial MRA. 3. Normal MRA of the neck.  She is currently not on any medications for her memory. She had an EEG through our office on 04/03/2021 and I reviewed the results: CONCLUSION: This is a  normal awake EEG.  There is no electrodiagnostic evidence of epileptiform discharge.  She was notified by phone call and the report was sent to her PCP at the time.  Previously:   02/20/21: 82 year old right-handed woman with a benign medical history, who reports intermittent right facial tingling.  Symptoms started about 3 weeks ago.  She has had intermittent lightheadedness, and had 1 syncopal spell at her eye doctors office some 3 weeks ago.  She was evaluated in the emergency room on 01/30/2021.  I reviewed the  emergency room records.  She was found to be mildly confused.  Work-up otherwise showed nonfocal exam, troponin negative, EKG reassuring.  Rapid urine drug screen was positive for THC.  She reports that she took a THC gummy in the morning around 8 AM.  She had not done this before.  She had purchased the [facility] Gummies in [place] in November 2021 and both she and her husband had taken a half gummy at the time.  She had not used it again until that morning.  She felt off balance.  She has since then not taken any.  She reports intermittent tingling sensation in the right forehead and face, no sudden onset of one-sided weakness or numbness or tingling  or droopy face or severe headache.  Sometimes she has tingling in the right arm and leg.  She has intermittently felt lightheaded but has not passed out since then.  A week ago she had worsening of her lightheadedness.  She went to her PCP office to the walk-in clinic and saw another provider.  Stroke was suspected or needed to be ruled out and she was advised to go to the emergency room via EMS.  They decided to go by themselves.   She presented to the emergency room on 02/13/2021 with a complaint of dizziness.  She felt lightheaded, she had had symptoms for 2 weeks.  She reported a recent syncopal event.  I reviewed the emergency room records.  She had a brain MRI without contrast on 02/13/2021, as well as MR angiogram of the head and neck and I reviewed the results:  IMPRESSION: 1. Normal brain MRI for age.  No acute intracranial abnormality. 2. Normal intracranial MRA. 3. Normal MRA of the neck.   An EEG was planned through the emergency room but due to mechanical issues they could not pursue it at the time. Blood work in the emergency room showed sodium of 136, potassium 3.8, glucose 101, BUN 18, creatinine 0.67, CBC showed WBC of 7, hemoglobin 13.2, hematocrit 40.8, normal platelets.  Magnesium was normal at 2.1.  Troponin was normal at 4.   She quit smoking many years ago, about 40 years ago.  She drinks alcohol daily in the form of wine, about 6 ounces per day.  She drinks water, 8 ounce bottles, about 4/day on average.  She drinks 1 cup of coffee in the morning and often soda with lunch.    Her Past Medical History Is Significant For: Past Medical History:  Diagnosis Date   Dizziness    Facial paresthesia    Laceration of right thumb    Pain in finger    Syncope     Her Past Surgical History Is Significant For: Past Surgical History:  Procedure Laterality Date   TONSILLECTOMY     URETER SURGERY      Her Family History Is Significant For: Family History  Problem Relation Age of  Onset   Alzheimer's disease Mother    Breast cancer Neg Hx     Her Social History Is Significant For: Social History   Socioeconomic History   Marital status: Married    Spouse name: Not on file   Number of children: Not on file   Years of education: Not on file   Highest education level: Master's degree (e.g., MA, MS, MEng, MEd, MSW, MBA)  Occupational History   Not on file  Tobacco Use   Smoking status: Former    Types: Cigarettes   Smokeless tobacco: Never  Vaping Use   Vaping Use: Never used  Substance and Sexual Activity   Alcohol use: Yes    Alcohol/week: 7.0 standard drinks of alcohol    Types: 7 Glasses of wine per week    Comment: usually 1 glass of red wine daily   Drug use: Never    Comment: THC gummies very rarely, x2 in her lifetime   Sexual activity: Not on file  Other Topics Concern   Not on file  Social History Narrative   Lives at home with husband    Right handed   Caffeine: 1/2 caff coffee    Social Determinants of Health   Financial Resource Strain: Not on file  Food Insecurity: Not on file  Transportation Needs: Not on file  Physical Activity: Not on file  Stress: Not on file  Social Connections: Not on file    Her Allergies Are:  Allergies  Allergen Reactions   Sulfa Antibiotics Other (See Comments)    Unknown reaction  :   Her Current Medications Are:  Outpatient Encounter Medications as of 12/02/2022  Medication Sig   CALCIUM PO 1 tablet with meals   cetirizine (ZYRTEC) 10 MG tablet Take 1 tablet (10 mg total) by mouth daily. (Patient taking differently: Take 10 mg by mouth daily. As needed)   Cyanocobalamin (VITAMIN B-12 PO) 1 tablet   diclofenac Sodium (VOLTAREN) 1 % GEL Apply 4 g topically 4 (four) times daily. (Patient taking differently: Apply 4 g topically 4 (four) times daily. As needed)   diltiazem (CARDIZEM CD) 120 MG 24 hr capsule Take 1 capsule by mouth daily.   MELATONIN PO as needed.   Multiple Vitamin (MULTIVITAMIN  PO) multivitamin   Omega-3 Fatty Acids (FISH OIL PO) 1 capsule   Red Yeast Rice Extract (RED YEAST RICE PO) 2 tablets   No facility-administered encounter medications on file as of 12/02/2022.  :   Review of Systems:  Out of a complete 14 point review of systems, all are reviewed and negative with the exception of these symptoms as listed below:   Review of Systems  Neurological:        Patient is here with her husband to address memory issues. Her husband states the patient qualified for a drug study back in 2017 because she was diagnosed with Alzheimer's by [facility]G who later was bought by [facility]. Patient states her mother passed at 35 and had Alzheimer's several years before she passed.     Objective:  Neurological Exam  Physical Exam Physical Examination:   Vitals:   12/02/22 1325  BP: (!) 97/54  Pulse: 67    General Examination: The patient is a very pleasant 82 y.o. female in no acute distress. She appears well-developed and well-nourished and well groomed.   HEENT: Normocephalic, atraumatic, pupils are equal, round and reactive to light, extraocular tracking is good without limitation to gaze excursion or nystagmus noted. Hearing is grossly intact. Face is symmetric with normal facial animation. Speech is clear with no dysarthria noted. There is no hypophonia. There is no lip, neck/head, jaw or voice tremor. Neck is supple with full range of passive and active motion. There are no carotid bruits on auscultation. Oropharynx exam reveals: mild mouth dryness, adequate dental hygiene. Tongue protrudes centrally and palate elevates symmetrically.   Chest: Clear to auscultation without wheezing, rhonchi or crackles noted.  Heart: S1+S2+0, regular and normal without murmurs, rubs or gallops noted.   Abdomen: Soft, non-tender and non-distended.  Extremities: There is no obvious  edema in the distal lower extremities bilaterally.   Skin: Warm and dry without trophic changes  noted.   Musculoskeletal: exam reveals no obvious joint deformities.   Neurologically:  Mental status: The patient is awake, alert and oriented in all 4 spheres. Her immediate and remote memory, attention, language skills and fund of knowledge are impaired, she is unable to provide many details to her history and often looks to her husband for response.  She is slow in responding at times.  Mild word finding difficulty.  No dysarthria. Thought process is linear. Mood is normal and affect is normal.      12/02/2022    2:19 PM  MMSE - Mini Mental State Exam  Orientation to time 2  Orientation to Place 4  Registration 3  Attention/ Calculation 3  Recall 2  Language- name 2 objects 2  Language- repeat 1  Language- follow 3 step command 3  Language- read & follow direction 1  Write a sentence 1  Copy design 1  Total score 23    On 12/02/2022: CDT: 4/4, AFT: 8/min. Cranial nerves II - XII are as described above under HEENT exam.  Motor exam: Normal bulk, strength and tone is noted. There is no obvious action or resting tremor.  Reflexes are 1+ throughout. Fine motor skills and coordination: grossly intact.  Cerebellar testing: No dysmetria or intention tremor. There is no truncal or gait ataxia.  Sensory exam: intact to light touch in the upper and lower extremities.  Gait, station and balance: She stands easily. No veering to one side is noted. No leaning to one side is noted. Posture is age-appropriate and stance is narrow based. Gait shows normal stride length and normal pace. No problems turning are noted.   Assessment and Plan:  In summary, Cathy Andrews is a very pleasant 82 y.o.-year old female with an underlying medical history of PVCs, hypertension, neck pain, facial paresthesias for which I had evaluated her in 2022, and history of syncope, who presents for evaluation of her memory loss of several years duration.  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  She participated from 2017 onwards in the Aduhelm trial.  She had infusion in October 2023 for the last time with a dose of 10 mg/kg.  Trial participation was June 2021 through 2023.  She started participating in the study in 2016 and went through 2020 as understand.  She had MRI testing and PET scans as part of the participation in her trial.  She did provide some paperwork of her trial through [place] and we will scan those papers into her chart.  We will proceed with workup today in the form of blood work and repeat MRI and PET scan testing.  We will consider memory medication but she is advised to follow-up with her cardiologist and your office for workup for syncope which could be cardiac in nature.  She does have a low blood pressure today but denies any orthostatic symptoms.  Before we consider something like Aricept, we have to make sure that she would be able to tolerate it at that point as Aricept can lower pulse rate.  We talked about the importance of maintaining a healthy lifestyle, she is encouraged to stay better hydrated with water, try to drink 6 to 8 cups of water per day and exercise on a regular basis, she does go to the gym 3 days a week.  We will keep her posted  as to her test results, I would like to see her back in about 3 months, sooner if needed.  She and her husband are advised that we are currently not treating any patients in our office with to help him but may start providing treatment with at a home IV in the near future and she may qualify for treatment.  I answered all the questions today and the patient and her husband were in agreement.   Thank you very much for allowing me to participate in the care of this nice patient. If I can be of any further assistance to you please do not hesitate to call me at [phone].  Sincerely,   Star Age, MD, PhD  I spent 45 minutes in total face-to-face time and in reviewing records during pre-charting, more  than 50% of which was spent in counseling and coordination of care, reviewing test results, reviewing medications and treatment regimen and/or in discussing or reviewing the diagnosis of memory loss, the prognosis and treatment options. Pertinent laboratory and imaging test results that were available during this visit with the patient were reviewed by me and considered in my medical decision making (see chart for details).

## 2022-12-02 NOTE — Patient Instructions (Addendum)
You have complaints of memory loss: memory loss or changes in cognitive function can have many reasons and does not always mean you have dementia. Conditions that can contribute to subjective or objective memory loss include: depression, stress, poor sleep from insomnia or sleep apnea, dehydration, fluctuation in blood sugar values, thyroid or electrolyte dysfunction and certain vitamin deficiencies. Dementia can be caused by stroke, brain atherosclerosis or brain vascular disease due to vascular risk factors (smoking, high blood pressure, high cholesterol, obesity and uncontrolled diabetes), certain degenerative brain disorders (including Parkinson's disease and Multiple sclerosis) and by Alzheimer's disease or other, more rare and sometimes hereditary causes. We will do some additional testing: blood work (which has been done recently already) and we will do a brain scan. We will not start medication as yet.   As discussed, we are currently not treating our patients with Aduhelm IV.  I am worried about your driving, please have your family monitor it and I would suggest at this point only local roads, familiar routes, no nighttime and no highway driving.   We will check blood work today and call you with the test results. We will do a brain scan, called MRI and call you with the test results. We will have to schedule you for this on a separate date. This test requires authorization from your insurance, and we will take care of the insurance process. I will also order a brain PET scan, you had a brain MRI and a PET scan as part of your Alzheimer's trial.  We will request insurance authorization for these tests through our clinic.  Please follow-up with your primary care as well as cardiologist for your passing out spells, we can consider an EEG through our clinic although the description of your passing out spell is not typical for seizure.

## 2022-12-03 LAB — TSH: TSH: 1.85 u[IU]/mL (ref 0.450–4.500)

## 2022-12-03 LAB — COMPREHENSIVE METABOLIC PANEL
ALT: 30 IU/L (ref 0–32)
AST: 27 IU/L (ref 0–40)
Albumin/Globulin Ratio: 2 (ref 1.2–2.2)
Albumin: 4.3 g/dL (ref 3.7–4.7)
Alkaline Phosphatase: 79 IU/L (ref 44–121)
BUN/Creatinine Ratio: 39 — ABNORMAL HIGH (ref 12–28)
BUN: 21 mg/dL (ref 8–27)
Bilirubin Total: <0.2 mg/dL (ref 0.0–1.2)
CO2: 24 mmol/L (ref 20–29)
Calcium: 9.4 mg/dL (ref 8.7–10.3)
Chloride: 101 mmol/L (ref 96–106)
Creatinine, Ser: 0.54 mg/dL — ABNORMAL LOW (ref 0.57–1.00)
Globulin, Total: 2.2 g/dL (ref 1.5–4.5)
Glucose: 99 mg/dL (ref 70–99)
Potassium: 4.4 mmol/L (ref 3.5–5.2)
Sodium: 139 mmol/L (ref 134–144)
Total Protein: 6.5 g/dL (ref 6.0–8.5)
eGFR: 91 mL/min/{1.73_m2} (ref >59)

## 2022-12-03 LAB — HGB A1C W/O EAG: Hgb A1c MFr Bld: 5.7 % — ABNORMAL HIGH (ref 4.8–5.6)

## 2022-12-03 LAB — B12 AND FOLATE PANEL
Folate: >20 ng/mL (ref >3.0)
Vitamin B-12: 1454 pg/mL — ABNORMAL HIGH (ref 232–1245)

## 2022-12-03 LAB — SEDIMENTATION RATE: Sed Rate: 2 mm/hr (ref 0–40)

## 2022-12-03 LAB — RPR: RPR Ser Ql: NONREACTIVE

## 2022-12-04 ENCOUNTER — Telehealth: Payer: Self-pay

## 2022-12-04 NOTE — Telephone Encounter (Signed)
Contacted pt, spoke to she and spouse. Advised results did not show any major abnormalities, hemoglobin A1c which is a marker for diabetes control was in the prediabetes range, I recommend monitoring this through primary care. Her vitamin B12 level was elevated above the normal range, she does take supplement.Advised she can stop it for now and get her vitamin B12 level rechecked by her primary care in 3 to 6 months. Other tests were normal. She and spouse verbally understood and was appreciative. Advised to call the office back with concerns or further questions.

## 2022-12-04 NOTE — Telephone Encounter (Signed)
-----  Message from Kary Kos, CMA sent at 12/04/2022  9:15 AM EST -----  ----- Message ----- From: Star Age, MD Sent: 12/03/2022   7:39 AM EST To: Gna-[facility] 4 Results  Please call patient or her husband: Her blood tests from yesterday did not show any major abnormalities, hemoglobin A1c which is a marker for diabetes control was in the prediabetes range, I recommend monitoring this through primary care.  Her vitamin B12 level was elevated above the normal range which is often the case in patients who take a supplement.  If she is taking a vitamin B12 supplement she can stop it for now and get her vitamin B12 level rechecked by her primary care in 3 to 6 months.  Other tests were normal.  As discussed we will plan to do a brain MRI and PET scan of the brain.

## 2022-12-05 ENCOUNTER — Telehealth: Payer: Self-pay | Admitting: Neurology

## 2022-12-05 NOTE — Telephone Encounter (Signed)
Mcarthur Rossetti Josem Kaufmann: [id number] exp. 12/05/22-01/04/23 sent to GI [phone]

## 2022-12-09 ENCOUNTER — Telehealth: Payer: Self-pay | Admitting: Neurology

## 2022-12-09 NOTE — Telephone Encounter (Signed)
Mcarthur Rossetti Josem Kaufmann: [id number] exp. 12/09/22-01/08/23 sent to [place]

## 2022-12-10 ENCOUNTER — Institutional Professional Consult (permissible substitution): Payer: Medicare PPO | Admitting: Neurology

## 2022-12-12 ENCOUNTER — Ambulatory Visit: Payer: Medicare PPO | Attending: Internal Medicine | Admitting: Cardiovascular Disease

## 2022-12-12 ENCOUNTER — Ambulatory Visit: Payer: Medicare PPO | Attending: Cardiovascular Disease

## 2022-12-12 ENCOUNTER — Encounter: Payer: Self-pay | Admitting: Cardiovascular Disease

## 2022-12-12 VITALS — BP 124/60 | HR 67 | Ht 62.0 in | Wt 128.8 lb

## 2022-12-12 DIAGNOSIS — I493 Ventricular premature depolarization: Secondary | ICD-10-CM

## 2022-12-12 DIAGNOSIS — R55 Syncope and collapse: Secondary | ICD-10-CM | POA: Diagnosis not present

## 2022-12-12 NOTE — Patient Instructions (Signed)
Medication Instructions:  Your physician recommends that you continue on your current medications as directed. Please refer to the Current Medication list given to you today.  *If you need a refill on your cardiac medications before your next appointment, please call your pharmacy*   Testing/Procedures:  [place] Monitor Instructions  Your physician has requested you wear a ZIO patch monitor for 14 days.  This is a single patch monitor. [employer] supplies one patch monitor per enrollment. Additional stickers are not available. Please do not apply patch if you will be having a Nuclear Stress Test,  Echocardiogram, Cardiac CT, MRI, or Chest Xray during the period you would be wearing the  monitor. The patch cannot be worn during these tests. You cannot remove and re-apply the  ZIO XT patch monitor.  Your ZIO patch monitor will be mailed 3 day USPS to your address on file. It may take 3-5 days  to receive your monitor after you have been enrolled.  Once you have received your monitor, please review the enclosed instructions. Your monitor  has already been registered assigning a specific monitor serial # to you.  Billing and Patient Assistance Program Information  We have supplied [employer] with any of your insurance information on file for billing purposes. [employer] offers a sliding scale Patient Assistance Program for patients that do not have  insurance, or whose insurance does not completely cover the cost of the ZIO monitor.  You must apply for the Patient Assistance Program to qualify for this discounted rate.  To apply, please call [employer] at [phone], select option 4, select option 2, ask to apply for  Patient Assistance Program. Theodore Demark will ask your household income, and how many people  are in your household. They will quote your out-of-pocket cost based on that information.  [employer] will also be able to set up a 74-month interest-free payment plan if needed.  Applying  the monitor   Shave hair from upper left chest.  Hold abrader disc by orange tab. Rub abrader in 40 strokes over the upper left chest as  indicated in your monitor instructions.  Clean area with 4 enclosed alcohol pads. Let dry.  Apply patch as indicated in monitor instructions. Patch will be placed under collarbone on left  side of chest with arrow pointing upward.  Rub patch adhesive wings for 2 minutes. Remove white label marked "1". Remove the white  label marked "2". Rub patch adhesive wings for 2 additional minutes.  While looking in a mirror, press and release button in center of patch. A small green light will  flash 3-4 times. This will be your only indicator that the monitor has been turned on.  Do not shower for the first 24 hours. You may shower after the first 24 hours.  Press the button if you feel a symptom. You will hear a small click. Record Date, Time and  Symptom in the Patient Logbook.  When you are ready to remove the patch, follow instructions on the last 2 pages of Patient  Logbook. Stick patch monitor onto the last page of Patient Logbook.  Place Patient Logbook in the blue and white box. Use locking tab on box and tape box closed  securely. The blue and white box has prepaid postage on it. Please place it in the mailbox as  soon as possible. Your physician should have your test results approximately 7 days after the  [facility]  Call [employer]  [facility] at [phone] if you have questions regarding  your ZIO XT patch monitor. Call them immediately if you see an orange light blinking on your  monitor.  If your monitor falls off in less than 4 days, contact our Monitor department at [phone].  If your monitor becomes loose or falls off after 4 days call [employer] at [phone] for  suggestions on securing your monitor   Follow-Up: At [facility], you and your health needs are our priority.  As part  of our continuing mission to provide you with exceptional heart care, we have created designated Provider Care Teams.  These Care Teams include your primary Cardiologist (physician) and Advanced Practice Providers (APPs -  Physician Assistants and Nurse Practitioners) who all work together to provide you with the care you need, when you need it.  We recommend signing up for the patient portal called "MyChart".  Sign up information is provided on this After Visit Summary.  MyChart is used to connect with patients for Virtual Visits (Telemedicine).  Patients are able to view lab/test results, encounter notes, upcoming appointments, etc.  Non-urgent messages can be sent to your provider as well.   To learn more about what you can do with MyChart, go to [email or web address].    Your next appointment:   6 month(s)  Provider:   Quay Burow, MD

## 2022-12-12 NOTE — Progress Notes (Unsigned)
Enrolled patient for a 14 day Zio XT  monitor to be mailed to patients home  °

## 2022-12-12 NOTE — Assessment & Plan Note (Signed)
Cathy Andrews was referred to me because of syncope.  She has had this before and has been evaluated by Dr. Beatrix Fetters.  She had a 2D echo that was normal and an event monitor that showed frequent PVCs and PACs.  She was placed on diltiazem.  Her most recent episode occurred since December where she was in bed with her husband and passed out twice.  It is possible that these were arrhythmogenic.  I told her that she cannot drive for 6 months.  Will recheck a 2-week Zio patch.

## 2022-12-12 NOTE — Progress Notes (Signed)
12/12/2022 Cathy Andrews   Feb 04, 1941  [id number]  Primary Physician Leslie Dales, DO Primary Cardiologist: Lorretta Harp MD Cathy Andrews, [place]  HPI:  Cathy Andrews is a 82 y.o. thin-appearing married Caucasian female mother of 2 children, grandmother of 3 grandchildren is accompanied by her husband Cathy Andrews who is a Engineer, water.  She was referred by Dr. Nelda Bucks for cardiovascular valuation because of PVCs and syncope.  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  She has no cardiovascular risk factors.  She is never had heart attack or stroke.  She denies chest pain or shortness of breath.  She is being evaluated for Alzheimer's disease.  She has been on diltiazem by Dr. Rosilyn Mings at [facility] in [facility] for PVCs.  She has had syncope in the past most recently 2 episodes since December that have occurred while in bed.  There is no witnessed seizure activity.  She did have a 2D echo back in 2022 which was normal an event monitor that showed frequent PVCs and PACs.   Current Meds  Medication Sig   CALCIUM PO 1 tablet with meals   cetirizine (ZYRTEC) 10 MG tablet Take 1 tablet (10 mg total) by mouth daily. (Patient taking differently: Take 10 mg by mouth daily. As needed)   Cyanocobalamin (VITAMIN B-12 PO) 1 tablet   diltiazem (CARDIZEM CD) 120 MG 24 hr capsule Take 1 capsule by mouth daily.   MELATONIN PO as needed.   Multiple Vitamin (MULTIVITAMIN PO) multivitamin   Omega-3 Fatty Acids (FISH OIL PO) 1 capsule   Red Yeast Rice Extract (RED YEAST RICE PO) 2 tablets     Allergies  Allergen Reactions   Sulfa Antibiotics Other (See Comments)    Unknown reaction    Social History   Socioeconomic History   Marital status: Married    Spouse name: Not on file   Number of children: Not on file   Years of education: Not on file   Highest education level: Master's degree (e.g., MA, MS, MEng, MEd, MSW, MBA)  Occupational History    Not on file  Tobacco Use   Smoking status: Former    Types: Cigarettes   Smokeless tobacco: Never  Vaping Use   Vaping Use: Never used  Substance and Sexual Activity   Alcohol use: Yes    Alcohol/week: 7.0 standard drinks of alcohol    Types: 7 Glasses of wine per week    Comment: usually 1 glass of red wine daily   Drug use: Never    Comment: THC gummies very rarely, x2 in her lifetime   Sexual activity: Not on file  Other Topics Concern   Not on file  Social History Narrative   Lives at home with husband    Right handed   Caffeine: 1/2 caff coffee    Social Determinants of Health   Financial Resource Strain: Not on file  Food Insecurity: Not on file  Transportation Needs: Not on file  Physical Activity: Not on file  Stress: Not on file  Social Connections: Not on file  Intimate Partner Violence: Not on file     Review of Systems: General: negative for chills, fever, night sweats or weight changes.  Cardiovascular: negative for chest pain, dyspnea on exertion, edema, orthopnea, palpitations, paroxysmal nocturnal dyspnea or shortness of breath Dermatological: negative for rash Respiratory: negative for cough or wheezing Urologic: negative for hematuria Abdominal: negative  for nausea, vomiting, diarrhea, bright red blood per rectum, melena, or hematemesis Neurologic: negative for visual changes, syncope, or dizziness All other systems reviewed and are otherwise negative except as noted above.    Blood pressure 124/60, pulse 67, height 5' 2"$  (1.575 m), weight 128 lb 12.8 oz (58.4 kg).  General appearance: alert and no distress Neck: no adenopathy, no carotid bruit, no JVD, supple, symmetrical, trachea midline, and thyroid not enlarged, symmetric, no tenderness/mass/nodules Lungs: clear to auscultation bilaterally Heart: regular rate and rhythm, S1, S2 normal, no murmur, click, rub or gallop Extremities: extremities normal, atraumatic, no cyanosis or edema Pulses: 2+  and symmetric Skin: Skin color, texture, turgor normal. No rashes or lesions Neurologic: Grossly normal  EKG sinus rhythm at 67 with left bundle branch block and occasional PVCs.  ASSESSMENT AND PLAN:   PVC (premature ventricular contraction) 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  Syncope and collapse [name] was referred to me because of syncope.  She has had this before and has been evaluated by Dr. Beatrix Fetters.  She had a 2D echo that was normal and an event monitor that showed frequent PVCs and PACs.  She was placed on diltiazem.  Her most recent episode occurred since December where she was in bed with her husband and passed out twice.  It is possible that these were arrhythmogenic.  I told her that she cannot drive for 6 months.  Will recheck a 2-week Zio patch.     Lorretta Harp MD FACP,FACC,FAHA, [facility] 12/12/2022 2:18 PM

## 2022-12-12 NOTE — Assessment & Plan Note (Signed)
Cathy Andrews has had PVCs for at least a year and has been followed by cardiologist [facility] in [facility].

## 2022-12-15 NOTE — Patient Instructions (Signed)

## 2022-12-15 NOTE — Progress Notes (Unsigned)
I connected with  Cathy Andrews on 12/16/2022 by a audio enabled telemedicine application and verified that I am speaking with the correct person using two identifiers.  Patient Location: Home  Provider Location: Home Office  I discussed the limitations of evaluation and management by telemedicine. The patient expressed understanding and agreed to proceed.  Subjective:   Cathy Andrews is a 82 y.o. female who presents for an Initial Medicare Annual Wellness Visit.  Review of Systems    Per HPI unless specifically indicated below.  Cardiac Risk Factors include: advanced age (>47mn, >>94women);female gender, Hypertension, and History of PVC.           Objective:       12/12/2022    1:48 PM 12/02/2022    2:23 PM 12/02/2022    1:25 PM  Vitals with BMI  Height 5' 2"$   5' 2"$   Weight 128 lbs 13 oz  128 lbs  BMI [id number] Systolic [id number]  Diastolic 60 62 54  Pulse 67 61 67    Today's Vitals   12/16/22 0841  PainSc: 0-No pain   There is no height or weight on file to calculate BMI.     12/16/2022    8:45 AM 10/29/2022    2:51 PM 10/16/2022    9:16 AM 03/19/2022    1:31 PM  Advanced Directives  Does Patient Have a Medical Advance Directive? Yes No No No  Type of AParamedicof AAvenue B and CLiving will     Does patient want to make changes to medical advance directive? No - Patient declined     Copy of HFive Cornersin Chart? No - copy requested     Would patient like information on creating a medical advance directive?  No - Patient declined No - Patient declined No - Patient declined    Current Medications (verified) Outpatient Encounter Medications as of 12/16/2022  Medication Sig   CALCIUM PO 1 tablet with meals   cetirizine (ZYRTEC) 10 MG tablet Take 1 tablet (10 mg total) by mouth daily. (Patient taking differently: Take 10 mg by mouth daily. As needed)   Cyanocobalamin (VITAMIN B-12 PO) 1 tablet   diclofenac Sodium  (VOLTAREN) 1 % GEL Apply 4 g topically 4 (four) times daily.   diltiazem (CARDIZEM CD) 120 MG 24 hr capsule Take 1 capsule by mouth daily.   MELATONIN PO as needed.   Multiple Vitamin (MULTIVITAMIN PO) multivitamin   Multiple Vitamins-Minerals (PRESERVISION AREDS 2 PO) Take by mouth.   Omega-3 Fatty Acids (FISH OIL PO) 1 capsule   Red Yeast Rice Extract (RED YEAST RICE PO) 2 tablets   No facility-administered encounter medications on file as of 12/16/2022.    Allergies (verified) Sulfa antibiotics   History: Past Medical History:  Diagnosis Date   Dizziness    Facial paresthesia    Laceration of right thumb    Pain in finger    Syncope    Past Surgical History:  Procedure Laterality Date   TONSILLECTOMY     URETER SURGERY     Family History  Problem Relation Age of Onset   Alzheimer's disease Mother    Breast cancer Neg Hx    Social History   Socioeconomic History   Marital status: Married    Spouse name: KLennette Bihari  Number of children: 2   Years of education: Not on file   Highest education level: Master's degree (e.g., MA, MS, MEng, MEd,  MSW, MBA)  Occupational History   Occupation: Retired  Tobacco Use   Smoking status: Former    Types: Cigarettes   Smokeless tobacco: Never  Scientific laboratory technician Use: Never used  Substance and Sexual Activity   Alcohol use: Yes    Alcohol/week: 7.0 standard drinks of alcohol    Types: 7 Glasses of wine per week    Comment: usually 1 glass of red wine daily   Drug use: Never    Comment: THC gummies very rarely, x2 in her lifetime   Sexual activity: Not on file  Other Topics Concern   Not on file  Social History Narrative   Lives at home with husband    Right handed   Caffeine: 1/2 caff coffee    Social Determinants of Health   Financial Resource Strain: Low Risk  (12/16/2022)   Overall Financial Resource Strain (CARDIA)    Difficulty of Paying Living Expenses: Not hard at all  Food Insecurity: No Food Insecurity  (12/16/2022)   Hunger Vital Sign    Worried About Running Out of Food in the Last Year: Never true    Ran Out of Food in the Last Year: Never true  Transportation Needs: No Transportation Needs (12/16/2022)   PRAPARE - Hydrologist (Medical): No    Lack of Transportation (Non-Medical): No  Physical Activity: Sufficiently Active (12/16/2022)   Exercise Vital Sign    Days of Exercise per Week: 7 days    Minutes of Exercise per Session: 60 min  Stress: No Stress Concern Present (12/16/2022)   [place]    Feeling of Stress : Not at all  Social Connections: Moderately Integrated (12/16/2022)   Social Connection and Isolation Panel [NHANES]    Frequency of Communication with Friends and Family: Twice a week    Frequency of Social Gatherings with Friends and Family: Once a week    Attends Religious Services: Never    Marine scientist or Organizations: No    Attends Music therapist: More than 4 times per year    Marital Status: Married    Tobacco Counseling Counseling given: No   Clinical Intake:  Pre-visit preparation completed: No  Pain : No/denies pain Pain Score: 0-No pain     Nutritional Status: BMI of 19-24  Normal Diabetes: No  How often do you need to have someone help you when you read instructions, pamphlets, or other written materials from your doctor or pharmacy?: 1 - Never  Diabetic?No  Interpreter Needed?: No  Information entered by :: Donnie Mesa, CMA   Activities of Daily Living    12/16/2022    8:38 AM  In your present state of health, do you have any difficulty performing the following activities:  Hearing? 0  Vision? 1  Comment wear Sela Hilding, MD [facility]  Difficulty concentrating or making decisions? 1  Walking or climbing stairs? 0  Dressing or bathing? 0  Doing errands, shopping? 0    Patient Care Team: Leslie Dales, DO as PCP - General (Family Medicine)  Indicate any recent Medical Services you may have received from other than [facility] providers in the past year (date may be approximate).     Assessment:   This is a routine wellness examination for [facility].  Hearing/Vision screen Denies any hearing issues. Denies any vision changes. Annual [place]  Dietary issues and exercise activities discussed: Current  Exercise Habits: Structured exercise class, Type of exercise: strength training/weights;walking, Time (Minutes): 60, Frequency (Times/Week): 7, Weekly Exercise (Minutes/Week): 420, Intensity: Moderate, Exercise limited by: None identified   Goals Addressed             This Visit's Progress    Stay Active and Independent-Low Back Pain       Why is this important?   Regular activity or exercise is important .  Activity helps to keep your muscles strong.  You will sleep better and feel more relaxed.  You will have more energy and feel less stressed.  If you are not active now, start slowly. Little changes make a big difference.  Rest, but not too much.  Stay as active as you can and listen to your body's signals.           Depression Screen    12/16/2022    8:36 AM 10/29/2022    2:55 PM 10/16/2022    9:17 AM 03/21/2022    4:23 PM 03/19/2022    1:30 PM 04/18/2021    1:47 PM  PHQ 2/9 Scores  PHQ - 2 Score 0 0 0 0 0 0  PHQ- 9 Score 0 0 0 0 0 0    Fall Risk    12/16/2022    8:37 AM 10/29/2022    2:57 PM 10/16/2022    9:17 AM 03/19/2022    1:31 PM 04/18/2021    1:47 PM  Fall Risk   Falls in the past year? 0 0 0 0 0  Number falls in past yr: 0 0 0 0 0  Injury with Fall? 0 0 0 0 0  Risk for fall due to : No Fall Risks      Follow up Falls evaluation completed        FALL RISK PREVENTION PERTAINING TO THE HOME:  Any stairs in or around the home? Yes  If so, are there any without handrails? No  Home free of loose throw rugs in walkways, pet beds, electrical  cords, etc? No  Adequate lighting in your home to reduce risk of falls? Yes   ASSISTIVE DEVICES UTILIZED TO PREVENT FALLS:  Life alert? No  Use of a cane, walker or w/c? No  Grab bars in the bathroom? Yes  Shower chair or bench in shower? Yes  Elevated toilet seat or a handicapped toilet? No   TIMED UP AND GO:  Was the test performed? Unable to perform, virtual appointment   Cognitive Function:    12/02/2022    2:19 PM  MMSE - Mini Mental State Exam  Orientation to time 2  Orientation to Place 4  Registration 3  Attention/ Calculation 3  Recall 2  Language- name 2 objects 2  Language- repeat 1  Language- follow 3 step command 3  Language- read & follow direction 1  Write a sentence 1  Copy design 1  Total score 23        12/16/2022    8:39 AM  6CIT Screen  What Year? 4 points  What month? 0 points  What time? 0 points  Count back from 20 0 points  Months in reverse 0 points  Repeat phrase 0 points  Total Score 4 points    Immunizations Immunization History  Administered Date(s) Administered   COVID-19, mRNA, vaccine(Comirnaty)12 years and older 08/03/2022   Fluad Quad(high Dose 65+) 08/03/2022   Influenza Split 07/31/2017, 07/06/2019   Influenza, High Dose Seasonal PF 08/11/2018   PFIZER Comirnaty(Gray  Top)Covid-19 Tri-Sucrose Vaccine 02/17/2021, 07/31/2021   PFIZER(Purple Top)SARS-COV-2 Vaccination 11/15/2019, 12/03/2019, 08/06/2020   PNEUMOCOCCAL CONJUGATE-20 03/19/2022   Pneumococcal Conjugate-13 07/26/2015   Pneumococcal Polysaccharide-23 10/06/2003, 05/13/2017   Td 11/20/2004   Tdap 05/13/2017, 07/08/2020   Zoster Recombinat (Shingrix) 07/30/2017, 11/08/2017   Zoster, Live 07/31/2017, 11/08/2017    TDAP status: Up to date  Flu Vaccine status: Up to date  Pneumococcal vaccine status: Up to date  Covid-19 vaccine status: Completed vaccines  Qualifies for Shingles Vaccine? Yes   Zostavax completed Yes   Shingrix Completed?: Yes  Screening  Tests Health Maintenance  Topic Date Due   Medicare Annual Wellness (AWV)  12/17/2023   DTaP/Tdap/Td (4 - Td or Tdap) 07/08/2030   Pneumonia Vaccine 50+ Years old  Completed   INFLUENZA VACCINE  Completed   DEXA SCAN  Completed   COVID-19 Vaccine  Completed   Zoster Vaccines- Shingrix  Completed   HPV VACCINES  Aged Out    Health Maintenance  There are no preventive care reminders to display for this patient.   Colorectal cancer screening: No longer required.   Mammogram status: No longer required due to age.  DEXA Scan: completed 10/09/2021  Lung Cancer Screening: (Low Dose CT Chest recommended if Age 30-80 years, 30 pack-year currently smoking OR have quit w/in 15years.) does not qualify.   Lung Cancer Screening Referral: not applicable   Additional Screening:  Hepatitis C Screening: does not qualify; not applicable   Vision Screening: Recommended annual ophthalmology exams for early detection of glaucoma and other disorders of the eye. Is the patient up to date with their annual eye exam?  Yes  Who is the provider or what is the name of the office in which the patient attends annual eye exams? [facility]  If pt is not established with a provider, would they like to be referred to a provider to establish care? No .   Dental Screening: Recommended annual dental exams for proper oral hygiene  Community Resource Referral / Chronic Care Management: CRR required this visit?  No   CCM required this visit?  No      Plan:     I have personally reviewed and noted the following in the patient's chart:   Medical and social history Use of alcohol, tobacco or illicit drugs  Current medications and supplements including opioid prescriptions. Patient is not currently taking opioid prescriptions. Functional ability and status Nutritional status Physical activity Advanced directives List of other physicians Hospitalizations, surgeries, and ER visits in previous 12  months Vitals Screenings to include cognitive, depression, and falls Referrals and appointments  In addition, I have reviewed and discussed with patient certain preventive protocols, quality metrics, and best practice recommendations. A written personalized care plan for preventive services as well as general preventive health recommendations were provided to patient.    Ms. Nash , Thank you for taking time to come for your Medicare Wellness Visit. I appreciate your ongoing commitment to your health goals. Please review the following plan we discussed and let me know if I can assist you in the future.   These are the goals we discussed:  Goals      Stay Active and Independent-Low Back Pain     Why is this important?   Regular activity or exercise is important .  Activity helps to keep your muscles strong.  You will sleep better and feel more relaxed.  You will have more energy and feel less stressed.  If you are  not active now, start slowly. Little changes make a big difference.  Rest, but not too much.  Stay as active as you can and listen to your body's signals.            This is a list of the screening recommended for you and due dates:  Health Maintenance  Topic Date Due   Medicare Annual Wellness Visit  12/17/2023   DTaP/Tdap/Td vaccine (4 - Td or Tdap) 07/08/2030   Pneumonia Vaccine  Completed   Flu Shot  Completed   DEXA scan (bone density measurement)  Completed   COVID-19 Vaccine  Completed   Zoster (Shingles) Vaccine  Completed   HPV Vaccine  Aged [address] Isa Rankin, [place]   12/16/2022  Nurse Notes: Approximately 30 minute Non-Face -To-Face Medicare Wellness Visit

## 2022-12-16 ENCOUNTER — Ambulatory Visit
Admission: RE | Admit: 2022-12-16 | Discharge: 2022-12-16 | Disposition: A | Discharge status: Home / Self Care | Payer: Medicare PPO | Source: Ambulatory Visit | Attending: Neurology | Admitting: Neurology

## 2022-12-16 ENCOUNTER — Ambulatory Visit (INDEPENDENT_AMBULATORY_CARE_PROVIDER_SITE_OTHER): Payer: Medicare PPO

## 2022-12-16 DIAGNOSIS — R55 Syncope and collapse: Secondary | ICD-10-CM

## 2022-12-16 DIAGNOSIS — I493 Ventricular premature depolarization: Secondary | ICD-10-CM | POA: Diagnosis not present

## 2022-12-16 DIAGNOSIS — R413 Other amnesia: Secondary | ICD-10-CM

## 2022-12-16 DIAGNOSIS — G309 Alzheimer's disease, unspecified: Secondary | ICD-10-CM

## 2022-12-16 DIAGNOSIS — Z0000 Encounter for general adult medical examination without abnormal findings: Secondary | ICD-10-CM | POA: Diagnosis not present

## 2022-12-16 DIAGNOSIS — Z820 Family history of epilepsy and other diseases of the nervous system: Secondary | ICD-10-CM

## 2022-12-17 ENCOUNTER — Telehealth: Payer: Self-pay | Admitting: *Deleted

## 2022-12-17 NOTE — Telephone Encounter (Signed)
Spoke with patient and [name] (on [place]) and discussed MRI brain results as noted below by Dr Rexene Alberts. Their questions were answered. PET brain scan will be on 12/26/22. We will call with those results when they are available. They verbalized appreciation for the call.

## 2022-12-17 NOTE — Telephone Encounter (Signed)
-----   Message from Star Age, MD sent at 12/16/2022  5:30 PM EST ----- Please advise patient or her husband that her recent brain MRI showed chronic findings, mostly in keeping with age-related changes.  No acute findings. We will proceed with her brain PET scan as planned.

## 2022-12-24 ENCOUNTER — Ambulatory Visit: Payer: Medicare PPO | Admitting: Cardiovascular Disease

## 2022-12-26 ENCOUNTER — Encounter (HOSPITAL_COMMUNITY)
Admission: RE | Admit: 2022-12-26 | Discharge: 2022-12-26 | Disposition: A | Discharge status: Home / Self Care | Payer: Medicare PPO | Source: Ambulatory Visit | Attending: Neurology | Admitting: Neurology

## 2022-12-26 DIAGNOSIS — G3189 Other specified degenerative diseases of nervous system: Secondary | ICD-10-CM | POA: Diagnosis not present

## 2022-12-26 DIAGNOSIS — R9089 Other abnormal findings on diagnostic imaging of central nervous system: Secondary | ICD-10-CM | POA: Diagnosis not present

## 2022-12-26 DIAGNOSIS — Z820 Family history of epilepsy and other diseases of the nervous system: Secondary | ICD-10-CM | POA: Diagnosis present

## 2022-12-26 DIAGNOSIS — R55 Syncope and collapse: Secondary | ICD-10-CM | POA: Insufficient documentation

## 2022-12-26 DIAGNOSIS — I6782 Cerebral ischemia: Secondary | ICD-10-CM | POA: Diagnosis not present

## 2022-12-26 DIAGNOSIS — R413 Other amnesia: Secondary | ICD-10-CM | POA: Diagnosis present

## 2022-12-26 DIAGNOSIS — G309 Alzheimer's disease, unspecified: Secondary | ICD-10-CM | POA: Insufficient documentation

## 2022-12-26 LAB — GLUCOSE, CAPILLARY: Glucose-Capillary: 114 mg/dL — ABNORMAL HIGH (ref 70–99)

## 2022-12-26 MED ORDER — FLUDEOXYGLUCOSE F - 18 (FDG) INJECTION
9.9000 | Freq: Once | INTRAVENOUS | Status: AC
  Administered 2022-12-26: 9.9 via INTRAVENOUS

## 2022-12-29 ENCOUNTER — Ambulatory Visit: Payer: Medicare PPO

## 2022-12-29 DIAGNOSIS — M8580 Other specified disorders of bone density and structure, unspecified site: Secondary | ICD-10-CM | POA: Insufficient documentation

## 2022-12-29 DIAGNOSIS — Z86010 Personal history of colonic polyps: Secondary | ICD-10-CM | POA: Insufficient documentation

## 2022-12-29 DIAGNOSIS — R413 Other amnesia: Secondary | ICD-10-CM | POA: Insufficient documentation

## 2022-12-29 DIAGNOSIS — R202 Paresthesia of skin: Secondary | ICD-10-CM | POA: Insufficient documentation

## 2022-12-31 ENCOUNTER — Telehealth: Payer: Self-pay | Admitting: *Deleted

## 2022-12-31 NOTE — Telephone Encounter (Signed)
LVM for patient to call back to go over test results. 

## 2022-12-31 NOTE — Telephone Encounter (Signed)
-----   Message from Star Age, MD sent at 12/29/2022  1:26 PM EST ----- Please advise patient or family member on DPR that her brain PET scan showed findings within normal limits.  Report suggests no decreased metabolism.  She can follow-up as scheduled.

## 2023-01-07 ENCOUNTER — Telehealth: Payer: Self-pay | Admitting: Cardiovascular Disease

## 2023-01-07 ENCOUNTER — Telehealth: Payer: Self-pay | Admitting: *Deleted

## 2023-01-07 NOTE — Telephone Encounter (Signed)
Spoke to patient  and husband (checked DPR) gave PET Scan result . Pt and husband had questions about pt starting Aricept . Pt and husband state pt passed out while traveling to [place] . Pt has appointment with Cardiologist in August . Per Dr Rexene Alberts since patient passed out will wait for patient to get evaluated by Cardiologist before  discussing  Aricept due to medication can lower heart rate and can cause bradycardia. Pt and husband expressed understanding. Informed patient if she has another episode of passing out please go to ED and get evaluated Pt thanked me for calling

## 2023-01-07 NOTE — Telephone Encounter (Signed)
-----   Message from Saima Athar, MD sent at 12/29/2022  1:26 PM EST ----- Please advise patient or family member on DPR that her brain PET scan showed findings within normal limits.  Report suggests no decreased metabolism.  She can follow-up as scheduled.  

## 2023-01-07 NOTE — Telephone Encounter (Signed)
Pt states she was on a cruise and someone there suggested she get off due to some tingling in her body. Husband in the back stating the cruise member mentioned something afib and suggested pt get on a blood thinner.   She also asked about monitor results, informed her I didn't see they were available yet.

## 2023-01-07 NOTE — Telephone Encounter (Signed)
Spoke with patient/husband they relay to me that they were on a cruise to [place]. Pt said she was feeling "tingling all over with pain in her head, neck, face and all the way down her right side". The MD on the cruise said the she needs get off the boat and go to the doctor. MD did an EKG-AFIB/flutter. Her BP at that time was 163/78 HR 57. She states that the tingling/pain on the right side. This is intermittent and has been happening since then. So they got off the ship in [place] and flew home last night and called here this morning.   She states that this tingling/pain continues. She is not able to describe this pain, when asked she states that she "does not know how to describe" she also is unable to place her pain scale either. I made an appt for next week. She will go to the ER if she feels appropriate in the mean time. Informed pt/husband to go to the ER. At the end of the call husband states that pt was wearing one of our Zio monitors, she sent it back 2-28. He states that she had "a few" of these episodes while she had the monitor on. Send message to monitors ([employer] has received her monitor and are processing data starting 3/6. It could take 2-3 days before we have results-per monitor dept) so the results should be resulted in time.

## 2023-01-10 ENCOUNTER — Telehealth: Payer: Self-pay | Admitting: Cardiology

## 2023-01-10 DIAGNOSIS — I493 Ventricular premature depolarization: Secondary | ICD-10-CM | POA: Diagnosis not present

## 2023-01-10 DIAGNOSIS — R55 Syncope and collapse: Secondary | ICD-10-CM | POA: Diagnosis not present

## 2023-01-10 NOTE — Telephone Encounter (Signed)
Notified by [employer] that patient had an episode of complete heart block on 2/14 at 11:46 PM. Episode lasted 4.9 seconds. Rep did not see any recurrent episodes of complete heart block on monitor.   Complete monitor has not yet been reviewed by Dr. Gwenlyn Found. Patient has an appointment on 3/12  Margie Billet, PA-C 01/10/2023 11:39 AM

## 2023-01-11 ENCOUNTER — Encounter (HOSPITAL_COMMUNITY): Payer: Self-pay

## 2023-01-11 ENCOUNTER — Telehealth: Payer: Self-pay | Admitting: Cardiology

## 2023-01-11 ENCOUNTER — Other Ambulatory Visit: Payer: Self-pay

## 2023-01-11 ENCOUNTER — Inpatient Hospital Stay (HOSPITAL_COMMUNITY)
Admission: EM | Admit: 2023-01-11 | Discharge: 2023-01-13 | DRG: 243 | Disposition: A | Discharge status: Home / Self Care | Payer: Medicare PPO | Attending: Internal Medicine | Admitting: Internal Medicine

## 2023-01-11 DIAGNOSIS — F0280 Dementia in other diseases classified elsewhere without behavioral disturbance: Secondary | ICD-10-CM | POA: Diagnosis not present

## 2023-01-11 DIAGNOSIS — I5022 Chronic systolic (congestive) heart failure: Secondary | ICD-10-CM | POA: Diagnosis not present

## 2023-01-11 DIAGNOSIS — I083 Combined rheumatic disorders of mitral, aortic and tricuspid valves: Secondary | ICD-10-CM | POA: Diagnosis not present

## 2023-01-11 DIAGNOSIS — G309 Alzheimer's disease, unspecified: Secondary | ICD-10-CM | POA: Diagnosis present

## 2023-01-11 DIAGNOSIS — I447 Left bundle-branch block, unspecified: Secondary | ICD-10-CM | POA: Diagnosis present

## 2023-01-11 DIAGNOSIS — I442 Atrioventricular block, complete: Secondary | ICD-10-CM | POA: Diagnosis not present

## 2023-01-11 DIAGNOSIS — Z950 Presence of cardiac pacemaker: Secondary | ICD-10-CM | POA: Diagnosis not present

## 2023-01-11 DIAGNOSIS — Z882 Allergy status to sulfonamides status: Secondary | ICD-10-CM

## 2023-01-11 DIAGNOSIS — Z79899 Other long term (current) drug therapy: Secondary | ICD-10-CM

## 2023-01-11 DIAGNOSIS — I5030 Unspecified diastolic (congestive) heart failure: Secondary | ICD-10-CM | POA: Diagnosis not present

## 2023-01-11 DIAGNOSIS — I495 Sick sinus syndrome: Secondary | ICD-10-CM | POA: Diagnosis not present

## 2023-01-11 DIAGNOSIS — Z820 Family history of epilepsy and other diseases of the nervous system: Secondary | ICD-10-CM | POA: Diagnosis not present

## 2023-01-11 DIAGNOSIS — R002 Palpitations: Principal | ICD-10-CM

## 2023-01-11 LAB — CBC WITH DIFFERENTIAL/PLATELET
Abs Immature Granulocytes: 0.03 10*3/uL (ref 0.00–0.07)
Basophils Absolute: 0.1 10*3/uL (ref 0.0–0.1)
Basophils Relative: 1 %
Eosinophils Absolute: 0.4 10*3/uL (ref 0.0–0.5)
Eosinophils Relative: 5 %
HCT: 38.7 % (ref 36.0–46.0)
Hemoglobin: 13.1 g/dL (ref 12.0–15.0)
Immature Granulocytes: 0 %
Lymphocytes Relative: 28 %
Lymphs Abs: 2 10*3/uL (ref 0.7–4.0)
MCH: 30.1 pg (ref 26.0–34.0)
MCHC: 33.9 g/dL (ref 30.0–36.0)
MCV: 89 fL (ref 80.0–100.0)
Monocytes Absolute: 0.6 10*3/uL (ref 0.1–1.0)
Monocytes Relative: 9 %
Neutro Abs: 4.1 10*3/uL (ref 1.7–7.7)
Neutrophils Relative %: 57 %
Platelets: 269 10*3/uL (ref 150–400)
RBC: 4.35 MIL/uL (ref 3.87–5.11)
RDW: 13.7 % (ref 11.5–15.5)
WBC: 7.1 10*3/uL (ref 4.0–10.5)
nRBC: 0 % (ref 0.0–0.2)

## 2023-01-11 LAB — BASIC METABOLIC PANEL
Anion gap: 10 (ref 5–15)
BUN: 19 mg/dL (ref 8–23)
CO2: 24 mmol/L (ref 22–32)
Calcium: 9.5 mg/dL (ref 8.9–10.3)
Chloride: 102 mmol/L (ref 98–111)
Creatinine, Ser: 0.65 mg/dL (ref 0.44–1.00)
GFR, Estimated: >60 mL/min (ref >60)
Glucose, Bld: 105 mg/dL — ABNORMAL HIGH (ref 70–99)
Potassium: 3.9 mmol/L (ref 3.5–5.1)
Sodium: 136 mmol/L (ref 135–145)

## 2023-01-11 MED ORDER — LORATADINE 10 MG PO TABS
10.0000 mg | ORAL_TABLET | Freq: Every day | ORAL | Status: DC
  Administered 2023-01-11 – 2023-01-12 (×2): 10 mg via ORAL
  Filled 2023-01-11 (×2): qty 1

## 2023-01-11 MED ORDER — ENOXAPARIN SODIUM 40 MG/0.4ML IJ SOSY: 40.0000 mg | PREFILLED_SYRINGE | INTRAMUSCULAR | Status: DC

## 2023-01-11 MED ORDER — ONDANSETRON HCL 4 MG/2ML IJ SOLN: 4.0000 mg | Freq: Four times a day (QID) | INTRAMUSCULAR | Status: DC | PRN

## 2023-01-11 MED ORDER — ACETAMINOPHEN 325 MG PO TABS: 650.0000 mg | ORAL_TABLET | ORAL | Status: DC | PRN

## 2023-01-11 NOTE — ED Provider Notes (Signed)
[facility] Provider Note   CSN: [id number] Arrival date & time: 01/11/23  1232     History  Chief Complaint  Patient presents with   Irregular Heart Beat    Cathy Andrews is a 82 y.o. female.  Patient presents emergency department at the recommendation of cardiology.  Patient has been suffering from intermittent syncopal episodes, lightheadedness, palpitations since late December.  She was monitored as an outpatient with a cardiac monitor for the past few weeks.  Cardiology called the patient this morning after noticing multiple sinus pauses which have lasted as long as 21 seconds the recommendation that she come to the emergency department for evaluation for possible pacemaker insertion.  Patient currently denies shortness of breath, chest pain, headache, abdominal pain, nausea, vomiting.  Past medical history significant for syncope, heart block, dementia  HPI     Home Medications Prior to Admission medications   Medication Sig Start Date End Date Taking? Authorizing Provider  Ascorbic Acid (VITAMIN C) 500 MG CHEW 1 tablet Orally Once a day    [provider]  CALCIUM PO 1 tablet with meals    [provider]  cetirizine (ZYRTEC) 10 MG tablet Take 1 tablet (10 mg total) by mouth daily. Patient taking differently: Take 10 mg by mouth daily. As needed 03/21/22   Holley Bouche, MD  Cholecalciferol 50 MCG (2000 UT) TABS 1 tablet Orally Once a day    [provider]  Cyanocobalamin (VITAMIN B-12 PO) 1 tablet    [provider]  diclofenac Sodium (VOLTAREN) 1 % GEL Apply 4 g topically 4 (four) times daily. 10/29/22   Ganta, Anupa, DO  diltiazem (CARDIZEM CD) 120 MG 24 hr capsule Take 1 capsule by mouth daily. 06/11/22 06/11/23  [provider]  HYDROcodone-acetaminophen (NORCO/VICODIN) 5-325 MG tablet TAKE 1 TABLET BY MOUTH EVERY 4 TO 6 HOURS FOR 5 DAYS AS NEEDED FOR PAIN    [provider]   ibuprofen (ADVIL) 200 MG tablet 1 tablet with food or milk as needed Orally Three times a day    [provider]  Lutein 10 MG TABS 1 capsule with a meal    [provider]  MELATONIN PO as needed.    [provider]  Multiple Vitamin (MULTIVITAMIN PO) multivitamin    [provider]  Multiple Vitamins-Minerals (PRESERVISION AREDS 2 PO) Take by mouth.    [provider]  Omega-3 Fatty Acids (FISH OIL PO) 1 capsule    [provider]  Red Yeast Rice Extract (RED YEAST RICE PO) 2 tablets    [provider]      Allergies    Sulfa antibiotics    Review of Systems   Review of Systems  Cardiovascular:  Positive for palpitations.  Neurological:  Positive for syncope and light-headedness.    Physical Exam Updated Vital Signs BP 133/77   Pulse (!) 54   Temp 97.7 F (36.5 C) (Oral)   Resp (!) 21   Ht '5\' 2"'$  (1.575 m)   Wt 57.2 kg   SpO2 98%   BMI 23.05 kg/m  Physical Exam HENT:     Head: Normocephalic and atraumatic.  Eyes:     Conjunctiva/sclera: Conjunctivae normal.  Cardiovascular:     Rate and Rhythm: Normal rate. Rhythm irregular.  Pulmonary:     Effort: Pulmonary effort is normal. No respiratory distress.  Musculoskeletal:        General: No signs of injury.  Cervical back: Normal range of motion.  Skin:    General: Skin is dry.  Neurological:     Mental Status: She is alert.  Psychiatric:        Speech: Speech normal.        Behavior: Behavior normal.     ED Results / Procedures / Treatments   Labs (all labs ordered are listed, but only abnormal results are displayed) Labs Reviewed  BASIC METABOLIC PANEL  CBC WITH DIFFERENTIAL/PLATELET    EKG None  Radiology No results found.  Procedures Procedures    Medications Ordered in ED Medications  acetaminophen (TYLENOL) tablet 650 mg (has no administration in time range)  ondansetron (ZOFRAN) injection 4 mg (has no administration in time  range)  enoxaparin (LOVENOX) injection 40 mg (has no administration in time range)  loratadine (CLARITIN) tablet 10 mg (has no administration in time range)    ED Course/ Medical Decision Making/ A&P                             Medical Decision Making Amount and/or Complexity of Data Reviewed Labs: ordered.  Risk Decision regarding hospitalization.   Patient presents with a chief complaint of palpitations at the recommendation of cardiology.  Have ordered basic labs including metabolic panel and complete blood count  I ordered an EKG.  Initial EKG showed sinus bradycardia with a rate of 58 with a left bundle branch block  I discussed the case with Dr. Gasper Sells of cardiology who agreed to see the patient and placed admission orders.  Patient admitted to the hospital for further evaluation by cardiology and likely implantation of pacemaker.        Final Clinical Impression(s) / ED Diagnoses Final diagnoses:  Palpitations    Rx / DC Orders ED Discharge Orders     None         Ronny Bacon 01/11/23 1403    Valarie Merino, MD 01/11/23 [phone]

## 2023-01-11 NOTE — H&P (Addendum)
Cardiology Admission History and Physical   Patient ID: MAXIE COLVERT MRN: [id number]; DOB: 1941-07-09   Admission date: 01/11/2023  PCP:  Leslie Dales, [facility] Cardiologist:  Quay Burow, MD     Chief Complaint:  Pauses and Complete Heart Block noted on Cardiac Monitor   Patient Profile:   MAKEYLA DESRAVINES is a 82 y.o. 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  History of Present Illness:   Ms. Tsutsui is an 82 year old female with above medical history. Per chart review, patient was referred to Dr. Gwenlyn Found in 12/2022 for evaluation of syncope. She had been taking diltiazem for PVCs. Patient had 2 syncopal episodes in 10/2022 that had occurred when she was laying in bed. Dr. Gwenlyn Found ordered a 2 week monitor for further evaluation.   Patient wore the heart monitor from 2/13 - 2/27. Monitor was mailed back and was resulted today. Monitor showed 25 pauses, longest lasting 21 seconds. Also showed 42 episodes of AV block (high grade 2nd degree Mobitz II and 3rd degree.) Patient was instructed to come to the ED for further evaluation and for consideration of pacemaker.   In the ED, patient reports that she has had at least 4 episodes of syncope since 10/25/22. Patient reports that the first episode occurred when she was laying in bed. She had turned to her left side to talk to her husband, and she woke up to her husband on the other side of the bed shaking her awake. The other episodes have occurred similarly. She has also had several episodes of near syncope and dizziness. Dizziness occasionally occurs when going from sitting to standing, but can also occur when she is resting/sitting. She denies recent fever, chills, body aches. Denies personal cardiac history.    Past Medical History:  Diagnosis Date   Dizziness     Facial paresthesia    Laceration of right thumb    Pain in finger    Syncope     Past Surgical History:  Procedure Laterality Date   TONSILLECTOMY     URETER SURGERY       Medications Prior to Admission: Prior to Admission medications   Medication Sig Start Date End Date Taking? Authorizing Provider  Ascorbic Acid (VITAMIN C) 500 MG CHEW 1 tablet Orally Once a day    [provider]  CALCIUM PO 1 tablet with meals    [provider]  cetirizine (ZYRTEC) 10 MG tablet Take 1 tablet (10 mg total) by mouth daily. Patient taking differently: Take 10 mg by mouth daily. As needed 03/21/22   Holley Bouche, MD  Cholecalciferol 50 MCG (2000 UT) TABS 1 tablet Orally Once a day    [provider]  Cyanocobalamin (VITAMIN B-12 PO) 1 tablet    [provider]  diclofenac Sodium (VOLTAREN) 1 % GEL Apply 4 g topically 4 (four) times daily. 10/29/22   Ganta, Anupa, DO  diltiazem (CARDIZEM CD) 120 MG 24 hr capsule Take 1 capsule by mouth daily. 06/11/22 06/11/23  [provider]  HYDROcodone-acetaminophen (NORCO/VICODIN) 5-325 MG tablet TAKE 1 TABLET BY MOUTH EVERY 4 TO 6 HOURS FOR 5 DAYS AS NEEDED FOR PAIN    [provider]  ibuprofen (ADVIL) 200 MG tablet 1 tablet with food or milk as needed Orally Three times a day    [provider]  Lutein 10 MG TABS 1 capsule with a meal    [provider]  MELATONIN PO as needed.    [provider]  Multiple Vitamin (MULTIVITAMIN PO) multivitamin    [provider]  Multiple Vitamins-Minerals (PRESERVISION AREDS 2 PO) Take by mouth.    [provider]  Omega-3 Fatty Acids (FISH OIL PO) 1 capsule    [provider]  Red Yeast Rice Extract (RED YEAST RICE PO) 2 tablets    [provider]     Allergies:    Allergies  Allergen Reactions   Sulfa Antibiotics Other (See Comments)    Unknown reaction    Social History:   Social History    Socioeconomic History   Marital status: Married    Spouse name: Lennette Bihari   Number of children: 2   Years of education: Not on file   Highest education level: Master's degree (e.g., MA, MS, MEng, MEd, MSW, MBA)  Occupational History   Occupation: Retired  Tobacco Use   Smoking status: Former    Types: Cigarettes   Smokeless tobacco: Never  Vaping Use   Vaping Use: Never used  Substance and Sexual Activity   Alcohol use: Yes    Alcohol/week: 7.0 standard drinks of alcohol    Types: 7 Glasses of wine per week    Comment: usually 1 glass of red wine daily   Drug use: Never    Comment: THC gummies very rarely, x2 in her lifetime   Sexual activity: Not on file  Other Topics Concern   Not on file  Social History Narrative   Lives at home with husband    Right handed   Caffeine: 1/2 caff coffee    Social Determinants of Health   Financial Resource Strain: Low Risk  (12/16/2022)   Overall Financial Resource Strain (CARDIA)    Difficulty of Paying Living Expenses: Not hard at all  Food Insecurity: No Food Insecurity (12/16/2022)   Hunger Vital Sign    Worried About Running Out of Food in the Last Year: Never true    Ran Out of Food in the Last Year: Never true  Transportation Needs: No Transportation Needs (12/16/2022)   PRAPARE - Hydrologist (Medical): No    Lack of Transportation (Non-Medical): No  Physical Activity: Sufficiently Active (12/16/2022)   Exercise Vital Sign    Days of Exercise per Week: 7 days    Minutes of Exercise per Session: 60 min  Stress: No Stress Concern Present (12/16/2022)   [place]    Feeling of Stress : Not at all  Social Connections: Moderately Integrated (12/16/2022)   Social Connection and Isolation Panel [NHANES]    Frequency of Communication with Friends and Family: Twice a week    Frequency of Social Gatherings with Friends and Family: Once a week     Attends Religious Services: Never    Marine scientist or Organizations: No    Attends Music therapist: More than 4 times per year    Marital Status: Married  Human resources officer Violence: Not At Risk (12/16/2022)   Humiliation, Afraid, Rape, and Kick questionnaire    Fear of Current or Ex-Partner: No    Emotionally Abused: No    Physically Abused: No    Sexually Abused: No    Family History:   The patient's family history includes Alzheimer's disease in her mother. There is no history  of Breast cancer.    ROS:  Please see the history of present illness.  All other ROS reviewed and negative.     Physical Exam/Data:   Vitals:   01/11/23 1242 01/11/23 1246 01/11/23 1330  BP: (!) 164/73  133/77  Pulse: 76  (!) 54  Resp: 18  (!) 21  Temp: 97.7 F (36.5 C)    TempSrc: Oral    SpO2: 100%  98%  Weight:  57.2 kg   Height:  '5\' 2"'$  (1.575 m)    No intake or output data in the 24 hours ending 01/11/23 1342    01/11/2023   12:46 PM 12/12/2022    1:48 PM 12/02/2022    1:25 PM  Last 3 Weights  Weight (lbs) 126 lb 128 lb 12.8 oz 128 lb  Weight (kg) 57.153 kg 58.423 kg 58.06 kg     Body mass index is 23.05 kg/m.  Physical Exam per MD    EKG:  The ECG that was done 3/10 was personally reviewed and demonstrates Sinus bradycardia with HR 58 BPM, LBBB  Relevant CV Studies:  Cardiac Monitor 12/2022 Patient had a min HR of 21 bpm, max HR of 171 bpm, and avg HR of 68 bpm. Predominant underlying rhythm was Sinus Rhythm. First Degree AV Block was present. Bundle Branch Block/IVCD was present. 11 Supraventricular Tachycardia runs occurred, the run with  the fastest interval lasting 4 beats with a max rate of 171 bpm, the longest lasting 14 beats with an avg rate of 104 bpm. 25 Pauses occurred, the longest lasting 21 secs (3 bpm). Some Pauses occurred due to High Grade AV Block and Complete Heart Block.  True duration of Pause(s) difficult to ascertain due to artifact. 42  episode(s) of AV Block (High Grade/2nd Mobitz II and 3rd) occurred, lasting a total of 55 mins 8 secs. Isolated SVEs were rare (<1.0%, 6430), SVE Couplets were occasional (3.1%,  21391), and SVE Triplets were rare (<1.0%, 1190). Isolated VEs were frequent (7.4%, [id number]), VE Couplets were occasional (2.3%, 15678), and no VE Triplets were present. Ventricular Bigeminy and Trigeminy were present. MD notification criteria for  Complete Heart Block (4.9-21.0 seconds of Ventricular Asystole) met - report posted prior to notification per account request ([facility]).  Laboratory Data:  High Sensitivity Troponin:  No results for input(s): "TROPONINIHS" in the last 720 hours.    ChemistryNo results for input(s): "NA", "K", "CL", "CO2", "GLUCOSE", "BUN", "CREATININE", "CALCIUM", "MG", "GFRNONAA", "GFRAA", "ANIONGAP" in the last 168 hours.  No results for input(s): "PROT", "ALBUMIN", "AST", "ALT", "ALKPHOS", "BILITOT" in the last 168 hours. Lipids No results for input(s): "CHOL", "TRIG", "HDL", "LABVLDL", "LDLCALC", "CHOLHDL" in the last 168 hours. HematologyNo results for input(s): "WBC", "RBC", "HGB", "HCT", "MCV", "MCH", "MCHC", "RDW", "PLT" in the last 168 hours. Thyroid No results for input(s): "TSH", "FREET4" in the last 168 hours. BNPNo results for input(s): "BNP", "PROBNP" in the last 168 hours.  DDimer No results for input(s): "DDIMER" in the last 168 hours.   Radiology/Studies:  No results found.   Assessment and Plan:   Complete Heart Block  Pauses on heart monitor  Syncope - Patient reports having 4 episodes of syncope in the past 4 months. These syncopal events have mostly occurred while she was laying in bed. Has also had dizziness, near syncope on multiple occasions  - She recently wore a cardiac monitor and full results are listed above- significant for 25 pauses (longest lasting 21 seconds) and 42 episodes of AV block  -  Patient will be seen tomorrow morning by EP to assess for possible  PPM. NPO at midnight  - Holding home diltiazem- last dose was this AM  - CBC, CMP pending. TSH from 12/02/22 was within normal limits  - Ordered Echocardiogram  - Continue to monitor on telemetry. If patient becomes hemodynamically unstable, can consider external pacing or temp ppm. For now, HR and BP are stable.    Risk Assessment/Risk Scores:            Severity of Illness: The appropriate patient status for this patient is INPATIENT. Inpatient status is judged to be reasonable and necessary in order to provide the required intensity of service to ensure the patient's safety. The patient's presenting symptoms, physical exam findings, and initial radiographic and laboratory data in the context of their chronic comorbidities is felt to place them at high risk for further clinical deterioration. Furthermore, it is not anticipated that the patient will be medically stable for discharge from the hospital within 2 midnights of admission.   * I certify that at the point of admission it is my clinical judgment that the patient will require inpatient hospital care spanning beyond 2 midnights from the point of admission due to high intensity of service, high risk for further deterioration and high frequency of surveillance required.*   For questions or updates, please contact [facility] Please consult [email or web address] for contact info under     Signed, Margie Billet, PA-C  01/11/2023 1:42 PM    Personally seen and examined. Agree with APP above with the following comments:  Briefly 82 yo F with  with a history of a history of PVCs and with chart review hx of Alzheimer's Dementia (in discussing with her and her husband she is very function). She has had multiple episodes of high great heart block.  She just got back from a cruise and she has multiple episodes of syncope.  Some occur just at rest  Patient had recent eval for syncope by Dr. Gwenlyn Found.  Heart monitor was returned with  multiple episodes of high grade AV block.  She takes low dose diltiazem.  No recent hikes.  NO hx of sarcoid or inflammatory disease.  Normal K.  No recent infections.  Does not need any support to walk.  Exam notable for irregularly irregular heart rate.  Exam is otherwise benign. EKG sinus bradycardia with LBBB Tele: sinus bradycardia 1st degree HB with PVCs  Would recommend  - echo - Step down, if needing transcutaneous pacing would plan for TVP; no pacing indication presently.  - holding diltiazem - planning for EP eval with potential of PPM tomorrow - EP service tomorrow  - full code  Rudean Haskell, MD [facility]  [address] [place], [place] [address] [phone]  2:27 PM

## 2023-01-11 NOTE — ED Notes (Signed)
ED TO INPATIENT HANDOFF REPORT  ED Nurse Name and Phone #: Leafy Ro, 21  S Name/Age/Gender Cathy Andrews 82 y.o. female Room/Bed: 031C/031C  Code Status   Code Status: Full Code  Home/SNF/Other Home Patient oriented to: self, place, time, and situation Is this baseline? Yes   Triage Complete: Triage complete  Chief Complaint Complete heart block ([place]) [I44.2]  Triage Note Pt was wearing a heart monitor for two weeks in February, pt just got a call today to come to the hospital to get a pacemaker. Pt currently c.o palpitations with some dizziness.Tingling in her face and right arm for a while. Denies chest pain. Pt a.o   Allergies Allergies  Allergen Reactions   Sulfa Antibiotics Other (See Comments)    Unknown reaction    Level of Care/Admitting Diagnosis ED Disposition     ED Disposition  Admit   Condition  --   Comment  Hospital Area: [facility] [[id number]]  Level of Care: Progressive [102]  Admit to Progressive based on following criteria: CARDIOVASCULAR & THORACIC of moderate stability with acute coronary syndrome symptoms/low risk myocardial infarction/hypertensive urgency/arrhythmias/heart failure potentially compromising stability and stable post cardiovascular intervention patients.  May admit patient to [facility] or [facility] if equivalent level of care is available:: No  Covid Evaluation: Asymptomatic - no recent exposure (last 10 days) testing not required  Diagnosis: Complete heart block [facility]) [id number]  Admitting Physician: Werner Lean [id number]  Attending Physician: Rudean Haskell [id number]  Certification:: I certify this patient will need inpatient services for at least 2 midnights  Estimated Length of Stay: 3          B Medical/Surgery History Past Medical History:  Diagnosis Date   Dizziness    Facial paresthesia    Laceration of right thumb    Pain in finger    Syncope    Past Surgical History:   Procedure Laterality Date   TONSILLECTOMY     URETER SURGERY       A IV Location/Drains/Wounds Patient Lines/Drains/Airways Status     Active Line/Drains/Airways     Name Placement date Placement time Site Days   Peripheral IV 01/11/23 20 G 1" Left Antecubital 01/11/23  1351  Antecubital  less than 1            Intake/Output Last 24 hours No intake or output data in the 24 hours ending 01/11/23 1355  Labs/Imaging No results found for this or any previous visit (from the past 56 hour(s)). No results found.  Pending Labs Unresulted Labs (From admission, onward)     Start     Ordered   01/18/23 0500  Creatinine, serum  (Pharmacologic VTE prophylaxis)  Weekly,   R     Comments: while on enoxaparin therapy    01/11/23 1318   01/12/23 [id number]  Basic metabolic panel  Daily,   R      01/11/23 1318   01/12/23 0500  CBC  Daily,   R      01/11/23 1318   01/11/23 [id number]  Basic metabolic panel  Once,   STAT        01/11/23 1311   01/11/23 1312  CBC with Differential  Once,   STAT        01/11/23 1311            Vitals/Pain Today's Vitals   01/11/23 1242 01/11/23 1246 01/11/23 1330  BP: (!) 164/73  133/77  Pulse: 76  (!) 54  Resp: 18  (!) 21  Temp: 97.7 F (36.5 C)    TempSrc: Oral    SpO2: 100%  98%  Weight:  57.2 kg   Height:  '5\' 2"'$  (1.575 m)   PainSc:  0-No pain     Isolation Precautions No active isolations  Medications Medications  acetaminophen (TYLENOL) tablet 650 mg (has no administration in time range)  ondansetron (ZOFRAN) injection 4 mg (has no administration in time range)  enoxaparin (LOVENOX) injection 40 mg (has no administration in time range)  loratadine (CLARITIN) tablet 10 mg (has no administration in time range)    Mobility walks     Focused Assessments Cardiac Assessment Handoff:    No results found for: "CKTOTAL", "CKMB", "CKMBINDEX", "TROPONINI" No results found for: "DDIMER" Does the Patient currently have chest pain? No     R Recommendations: See Admitting Provider Note  Report given to:   Additional Notes: Pt is AOX4, walky/talky, great historian, able to answer questions and verbalize needs, cards has seen her, sounds like she may be getting a Pacemaker

## 2023-01-11 NOTE — ED Triage Notes (Signed)
Pt was wearing a heart monitor for two weeks in February, pt just got a call today to come to the hospital to get a pacemaker. Pt currently c.o palpitations with some dizziness.Tingling in her face and right arm for a while. Denies chest pain. Pt a.o

## 2023-01-11 NOTE — Telephone Encounter (Signed)
Dr. Gwenlyn Found contracted me this AM with patient's heart monitor results. Patient recently wore a 2 week monitor that showed 25 pauses with the longest pause lasting 21 seconds. Some of these pauses occurred due to high grade AV block and complete heart block. Monitor also showed 42 episodes of AV block (high grade/2nd degree Mobitz II and 3rd degree). Discussed with Dr. Caryl Comes with EP and with Dr. Gwenlyn Found, who recommend that patient come to the ED and be admitted for evaluation of pacemaker.   I contacted patient who reported that she continued to have some issues with dizziness and near syncope. Discussed monitor results and our recommendations. Patient is agreeable to coming to the [facility] ED this afternoon.   Margie Billet, PA-C 01/11/2023 11:39 AM

## 2023-01-12 ENCOUNTER — Inpatient Hospital Stay (HOSPITAL_COMMUNITY): Payer: Medicare PPO

## 2023-01-12 ENCOUNTER — Encounter (HOSPITAL_COMMUNITY)
Admission: EM | Disposition: A | Discharge status: Home / Self Care | Payer: Self-pay | Source: Home / Self Care | Attending: Internal Medicine

## 2023-01-12 ENCOUNTER — Other Ambulatory Visit: Payer: Self-pay

## 2023-01-12 DIAGNOSIS — I5030 Unspecified diastolic (congestive) heart failure: Secondary | ICD-10-CM

## 2023-01-12 DIAGNOSIS — I442 Atrioventricular block, complete: Secondary | ICD-10-CM | POA: Diagnosis not present

## 2023-01-12 DIAGNOSIS — I083 Combined rheumatic disorders of mitral, aortic and tricuspid valves: Secondary | ICD-10-CM

## 2023-01-12 HISTORY — PX: PACEMAKER IMPLANT: EP1218

## 2023-01-12 LAB — BASIC METABOLIC PANEL
Anion gap: 11 (ref 5–15)
BUN: 18 mg/dL (ref 8–23)
CO2: 24 mmol/L (ref 22–32)
Calcium: 9 mg/dL (ref 8.9–10.3)
Chloride: 102 mmol/L (ref 98–111)
Creatinine, Ser: 0.68 mg/dL (ref 0.44–1.00)
GFR, Estimated: >60 mL/min (ref >60)
Glucose, Bld: 101 mg/dL — ABNORMAL HIGH (ref 70–99)
Potassium: 3.6 mmol/L (ref 3.5–5.1)
Sodium: 137 mmol/L (ref 135–145)

## 2023-01-12 LAB — CBC
HCT: 36.5 % (ref 36.0–46.0)
Hemoglobin: 12.4 g/dL (ref 12.0–15.0)
MCH: 30 pg (ref 26.0–34.0)
MCHC: 34 g/dL (ref 30.0–36.0)
MCV: 88.4 fL (ref 80.0–100.0)
Platelets: 252 10*3/uL (ref 150–400)
RBC: 4.13 MIL/uL (ref 3.87–5.11)
RDW: 13.9 % (ref 11.5–15.5)
WBC: 7.6 10*3/uL (ref 4.0–10.5)
nRBC: 0 % (ref 0.0–0.2)

## 2023-01-12 LAB — MAGNESIUM: Magnesium: 2.2 mg/dL (ref 1.7–2.4)

## 2023-01-12 LAB — SURGICAL PCR SCREEN
MRSA, PCR: NEGATIVE
Staphylococcus aureus: NEGATIVE

## 2023-01-12 SURGERY — PACEMAKER IMPLANT

## 2023-01-12 MED ORDER — SODIUM CHLORIDE 0.9 % IV SOLN: INTRAVENOUS | Status: DC

## 2023-01-12 MED ORDER — FENTANYL CITRATE (PF) 100 MCG/2ML IJ SOLN
INTRAMUSCULAR | Status: AC
  Filled 2023-01-12: qty 2

## 2023-01-12 MED ORDER — MIDAZOLAM HCL 5 MG/5ML IJ SOLN
INTRAMUSCULAR | Status: AC
  Filled 2023-01-12: qty 5

## 2023-01-12 MED ORDER — MIDAZOLAM HCL 5 MG/5ML IJ SOLN
INTRAMUSCULAR | Status: DC | PRN
  Administered 2023-01-12: 1 mg via INTRAVENOUS

## 2023-01-12 MED ORDER — GADOBUTROL 1 MMOL/ML IV SOLN
6.0000 mL | Freq: Once | INTRAVENOUS | Status: AC | PRN
  Administered 2023-01-12: 6 mL via INTRAVENOUS

## 2023-01-12 MED ORDER — LIDOCAINE HCL (PF) 1 % IJ SOLN
INTRAMUSCULAR | Status: AC
  Filled 2023-01-12: qty 60

## 2023-01-12 MED ORDER — SODIUM CHLORIDE 0.9 % IV SOLN: 250.0000 mL | INTRAVENOUS | Status: DC

## 2023-01-12 MED ORDER — CEFAZOLIN SODIUM-DEXTROSE 2-4 GM/100ML-% IV SOLN
2.0000 g | INTRAVENOUS | Status: AC
  Administered 2023-01-12: 2 g via INTRAVENOUS

## 2023-01-12 MED ORDER — LIDOCAINE HCL (PF) 1 % IJ SOLN
INTRAMUSCULAR | Status: DC | PRN
  Administered 2023-01-12: 50 mL

## 2023-01-12 MED ORDER — CEFAZOLIN SODIUM-DEXTROSE 2-4 GM/100ML-% IV SOLN
INTRAVENOUS | Status: AC
  Filled 2023-01-12: qty 100

## 2023-01-12 MED ORDER — SODIUM CHLORIDE 0.9 % IV SOLN
INTRAVENOUS | Status: AC
  Filled 2023-01-12: qty 2

## 2023-01-12 MED ORDER — POTASSIUM CHLORIDE 20 MEQ PO PACK
40.0000 meq | PACK | Freq: Once | ORAL | Status: AC
  Administered 2023-01-12: 40 meq via ORAL
  Filled 2023-01-12: qty 2

## 2023-01-12 MED ORDER — HEPARIN (PORCINE) IN NACL 1000-0.9 UT/500ML-% IV SOLN
INTRAVENOUS | Status: DC | PRN
  Administered 2023-01-12: 500 mL

## 2023-01-12 MED ORDER — CHLORHEXIDINE GLUCONATE 4 % EX LIQD: 60.0000 mL | Freq: Once | CUTANEOUS | Status: DC

## 2023-01-12 MED ORDER — SODIUM CHLORIDE 0.9 % IV SOLN
80.0000 mg | INTRAVENOUS | Status: AC
  Administered 2023-01-12: 80 mg
  Filled 2023-01-12: qty 2

## 2023-01-12 MED ORDER — FENTANYL CITRATE (PF) 100 MCG/2ML IJ SOLN
INTRAMUSCULAR | Status: DC | PRN
  Administered 2023-01-12: 25 ug via INTRAVENOUS

## 2023-01-12 MED ORDER — POTASSIUM CHLORIDE 10 MEQ/100ML IV SOLN
10.0000 meq | INTRAVENOUS | Status: AC
  Administered 2023-01-12 (×2): 10 meq via INTRAVENOUS
  Filled 2023-01-12 (×2): qty 100

## 2023-01-12 SURGICAL SUPPLY — 13 items
CABLE SURGICAL S-101-97-12 (CABLE) ×1 IMPLANT
CATH RIGHTSITE C315HIS02 (CATHETERS) IMPLANT
IPG PACE AZUR XT DR MRI W1DR01 (Pacemaker) IMPLANT
LEAD CAPSURE NOVUS 5076-52CM (Lead) IMPLANT
LEAD SELECT SECURE 3830 383069 (Lead) IMPLANT
PACE AZURE XT DR MRI W1DR01 (Pacemaker) ×1 IMPLANT
PAD DEFIB RADIO PHYSIO CONN (PAD) ×1 IMPLANT
SELECT SECURE 3830 383069 (Lead) ×1 IMPLANT
SHEATH 7FR PRELUDE SNAP 13 (SHEATH) IMPLANT
SHEATH PROBE COVER 6X72 (BAG) IMPLANT
SLITTER 6232ADJ (MISCELLANEOUS) IMPLANT
TRAY PACEMAKER INSERTION (PACKS) ×1 IMPLANT
WIRE HI TORQ VERSACORE-J 145CM (WIRE) IMPLANT

## 2023-01-12 NOTE — Consult Note (Signed)
ELECTROPHYSIOLOGY CONSULT NOTE    Patient ID: Cathy Andrews MRN: [id number], DOB/AGE: 02-05-1941 82 y.o.  Admit date: 01/11/2023 Date of Consult: 01/12/2023  Primary Physician: Leslie Dales, DO Primary Cardiologist: Quay Burow, MD  Electrophysiologist: none   Referring Provider: Dr. Gasper Sells  Patient Profile: Cathy Andrews is a 82 y.o. 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 Gasper Sells.  HPI:  Cathy Andrews is a 82 y.o. female with above PMH. Per chart review, patient was referred to Dr. Gwenlyn Found in 12/2022 for evaluation of syncope. She had been taking diltiazem for PVCs. Patient had 2 syncopal episodes in 10/2022 that had occurred when she was laying in bed and turned to L side. Dr. Gwenlyn Found ordered a 2 week monitor for further evaluation.    Patient wore the heart monitor from 2/13 - 2/27. Monitor resulted 3/10 and showed 25 pauses, longest lasting 21 seconds. Also showed 42 episodes of AV block (high grade 2nd degree Mobitz II and 3rd degree.) Patient was instructed to come to the ED for further evaluation and for consideration of pacemaker.   She feels well this AM, requests to have PPM placed as soon as possible.  Her husband, Lennette Bihari, is at bedside.   She had some dizziness yesterday in ER while checking in, none since.   She denies chest pain, palpitations, dyspnea, PND, orthopnea, nausea, vomiting, syncope, edema, weight gain, or early satiety.   Labs Potassium3.6 (03/11 0035)   Creatinine, ser  0.68 (03/11 0035) PLT  252 (03/11 0035) HGB  12.4 (03/11 0035) WBC 7.6 (03/11 0035)  .    Past Medical History:  Diagnosis Date   Dizziness    Facial paresthesia    Laceration of right thumb    Pain in finger    Syncope      Surgical History:  Past Surgical History:  Procedure Laterality Date   TONSILLECTOMY     URETER SURGERY       Medications  Prior to Admission  Medication Sig Dispense Refill Last Dose   Ascorbic Acid (VITAMIN C) 500 MG CHEW Chew 500 mg by mouth daily.   01/11/2023   CALCIUM PO Take 1 tablet by mouth daily.   01/11/2023   cetirizine (ZYRTEC) 10 MG tablet Take 1 tablet (10 mg total) by mouth daily. (Patient taking differently: Take 10 mg by mouth daily. As needed) 30 tablet 0 unk   Cholecalciferol 50 MCG (2000 UT) TABS Take 2,000 Units by mouth daily.   01/11/2023   Cyanocobalamin (VITAMIN B-12 PO) Take 1 tablet by mouth daily.   01/11/2023   diltiazem (CARDIZEM CD) 120 MG 24 hr capsule Take 1 capsule by mouth daily.   01/11/2023   ibuprofen (ADVIL) 200 MG tablet Take 200 mg by mouth daily as needed for mild pain.   unk   MELATONIN PO as needed.   unk   Multiple Vitamin (MULTIVITAMIN PO) Take 1 tablet by mouth daily.   01/11/2023   Omega-3 Fatty Acids (FISH OIL PO) Take 1 capsule by mouth daily.   01/11/2023   Red Yeast Rice Extract (RED YEAST RICE PO) Take 2 tablets by mouth daily.   01/11/2023   diclofenac Sodium (VOLTAREN) 1 % GEL Apply 4 g topically 4 (four) times daily. (Patient not taking: Reported on 01/11/2023) 4 g 1 Not Taking   HYDROcodone-acetaminophen (NORCO/VICODIN) 5-325 MG tablet TAKE 1 TABLET BY  MOUTH EVERY 4 TO 6 HOURS FOR 5 DAYS AS NEEDED FOR PAIN (Patient not taking: Reported on 01/11/2023)   Not Taking   Lutein 10 MG TABS 1 capsule with a meal (Patient not taking: Reported on 01/11/2023)   Not Taking    Inpatient Medications:   loratadine  10 mg Oral QHS    Allergies:  Allergies  Allergen Reactions   Sulfa Antibiotics Other (See Comments)    Unknown reaction    Family History  Problem Relation Age of Onset   Alzheimer's disease Mother    Breast cancer Neg Hx      Physical Exam: Vitals:   01/11/23 1554 01/11/23 1928 01/11/23 2332 01/12/23 0526  BP: (!) 125/97 130/71 (!) 122/54 124/68  Pulse:  60 61 60  Resp: '18 18 18 18  '$ Temp: 97.8 F (36.6 C) (!) 97.5 F (36.4 C) (!) 97.5 F (36.4 C)  98.2 F (36.8 C)  TempSrc: Oral Oral Oral Oral  SpO2: 95% 99% 95% 99%  Weight: 56.8 kg   56.7 kg  Height: '5\' 2"'$  (1.575 m)       GEN- NAD, A&O x 3, normal affect HEENT: Normocephalic, atraumatic Lungs- CTAB, Normal effort.  Heart- Regular rate and rhythm, No M/G/R.  GI- Soft, NT, ND.  Extremities- No clubbing, cyanosis, or edema   Radiology/Studies:  Long Term Monitor, 01/10/23 Patch Wear Time:  13 days and 20 hours (2024-02-13T21:28:35-0500 to 2024-02-27T17:42:46-0500)  Patient had a min HR of 21 bpm, max HR of 171 bpm, and avg HR of 68 bpm. Predominant underlying rhythm was Sinus Rhythm. First Degree AV Block was present. Bundle Branch Block/IVCD was present. 11 Supraventricular Tachycardia runs occurred, the run with  the fastest interval lasting 4 beats with a max rate of 171 bpm, the longest lasting 14 beats with an avg rate of 104 bpm. 25 Pauses occurred, the longest lasting 21 secs (3 bpm). Some Pauses occurred due to High Grade AV Block and Complete Heart Block.  True duration of Pause(s) difficult to ascertain due to artifact. 42 episode(s) of AV Block (High Grade/2nd Mobitz II and 3rd) occurred, lasting a total of 55 mins 8 secs. Isolated SVEs were rare (<1.0%, 6430), SVE Couplets were occasional (3.1%,  21391), and SVE Triplets were rare (<1.0%, 1190). Isolated VEs were frequent (7.4%, [id number]), VE Couplets were occasional (2.3%, 15678), and no VE Triplets were present. Ventricular Bigeminy and Trigeminy were present. MD notification criteria for  Complete Heart Block (4.9-21.0 seconds of Ventricular Asystole) met - report posted prior to notification per account request ([facility]).   NM PET Metabolic Brain  Result Date: 12/29/2022 CLINICAL DATA:  Memory loss. Dementia. Frontotemporal dementia versus Alzheimer's type dementia EXAM: NM PET METABOLIC BRAIN TECHNIQUE: 9.9 mCi F-18 FDG was injected intravenously. Full-ring PET imaging was performed from the vertex to skull base. CT data  was obtained and used for attenuation correction and anatomic localization. FASTING BLOOD GLUCOSE:  Value: 114 mg/dl COMPARISON:  None Available. FINDINGS: No decreased relative cortical metabolism within the high parietal lobes. Normal relative cortical metabolism in the frontal lobes. Normal metabolism in the temporal lobes. Normal cortical metabolism within the occipital lobes. IMPRESSION: No decreased relative cortical metabolism within the parietal lobes to suggest Alzheimer's disease pathology. Cerebral metabolic activity within normal limits. Electronically Signed   By: Suzy Bouchard M.D.   On: 12/29/2022 13:19   MR BRAIN WO CONTRAST  Result Date: 12/16/2022  [facility] [address], [place], [place] [address] [phone]  NEUROIMAGING REPORT STUDY DATE: 12/16/2022 PATIENT NAME: AYAA CZARNIK DOB: 1941-04-27 MRN: [id number] ORDERING CLINICIAN: Star Age CLINICAL HISTORY: 82 year old patient being evaluated for syncope and memory loss COMPARISON FILMS: MRI brain 02/13/2021 EXAM: MRI Brain wo TECHNIQUE: Sagittal T1, axial T1, T2, FLAIR, DWI, ADC map, SWI, coronal T2 images were obtained through the brain CONTRAST: None IMAGING SITE: [facility] imaging FINDINGS: The brain parenchyma shows mild changes of age-related chronic small vessel disease and generalized cerebral atrophy which is slightly age disproportionate.  No other structural lesion, tumor or infarcts are noted.  Diffusion-weighted imaging is negative for acute ischemia.  SWI images do not show significant microhemorrhages.  The subarachnoid spaces and ventricular system show slight dilatation.  The cortical sulci and gyri show slight atrophy.  Extra-axial brain structures appear normal.  Calvarium shows no abnormalities.  Orbits appear unremarkable.  Paranasal sinuses show mild chronic inflammatory changes.  The pituitary gland and cerebellar tonsils appear normal.  Flow-voids of large vessels are Entrikin  circulation appear to be patent to the right vertebral artery and basilar artery appear to be of diminutive caliber.  The craniovertebral junction and visualized portion of the cervical spine appear unremarkable.   MRI scan of the brain without contrast showing mild age-related changes of chronic small vessel disease and generalized cerebral atrophy.  No acute abnormalities. INTERPRETING PHYSICIAN: Antony Contras, MD Certified in  Neuroimaging by [place] [employer] and [employer]   EKG: 3/10 SB with LBBB (personally reviewed)  TELEMETRY: SB/SR 50-80s with frequent PVCs bigeminal and trigeminal   (personally reviewed)   Assessment/Plan:  #) Sinus node dysfunction  syncope #) PVCs Echo result pending Previous echo 04/2021 in care everywhere: LVEF 55-60%, mild MR PVCs had been well-controlled with '120mg'$  dilt daily, being held currently No pauses on tele, but having frequent PVCs. Thankfully, asymptomatic from PVCs currently  Anticipate dual chamber PPM NPO for procedure   Dr. Quentin Ore to see   For questions or updates, please contact [facility] Please consult [email or web address] for contact info under Cardiology/STEMI.  Signed, Mamie Levers, NP  01/12/2023 8:28 AM

## 2023-01-12 NOTE — TOC Progression Note (Signed)
Transition of Care [facility]) - Progression Note    Patient Details  Name: Cathy Andrews MRN: [id number] Date of Birth: [birth year]  Transition of Care [facility]) CM/SW Contact  Zenon Mayo, RN Phone Number: 01/12/2023, 4:56 PM  Clinical Narrative:    from home with spouse, indep, [facility], for pace maker placement today.  TOC following.        Expected Discharge Plan and Services                                               Social Determinants of Health (SDOH) Interventions SDOH Screenings   Food Insecurity: No Food Insecurity (01/11/2023)  Housing: Low Risk  (01/11/2023)  Transportation Needs: No Transportation Needs (01/11/2023)  Utilities: Not At Risk (01/11/2023)  Alcohol Screen: Low Risk  (12/16/2022)  Depression (PHQ2-9): Low Risk  (12/16/2022)  Financial Resource Strain: Low Risk  (12/16/2022)  Physical Activity: Sufficiently Active (12/16/2022)  Social Connections: Moderately Integrated (12/16/2022)  Stress: No Stress Concern Present (12/16/2022)  Tobacco Use: Medium Risk (01/11/2023)    Readmission Risk Interventions     No data to display

## 2023-01-12 NOTE — Telephone Encounter (Signed)
Pt is currently admitted

## 2023-01-12 NOTE — Plan of Care (Signed)
  Problem: Nutrition: Goal: Adequate nutrition will be maintained Outcome: Completed/Met   Problem: Elimination: Goal: Will not experience complications related to bowel motility Outcome: Completed/Met Goal: Will not experience complications related to urinary retention Outcome: Completed/Met   Problem: Pain Managment: Goal: General experience of comfort will improve Outcome: Completed/Met   Problem: Skin Integrity: Goal: Risk for impaired skin integrity will decrease Outcome: Completed/Met   

## 2023-01-13 ENCOUNTER — Inpatient Hospital Stay (HOSPITAL_COMMUNITY): Payer: Medicare PPO

## 2023-01-13 ENCOUNTER — Encounter (HOSPITAL_COMMUNITY): Payer: Self-pay | Admitting: Cardiology

## 2023-01-13 ENCOUNTER — Ambulatory Visit: Payer: Medicare PPO | Admitting: Nurse Practitioner

## 2023-01-13 LAB — BASIC METABOLIC PANEL
Anion gap: 7 (ref 5–15)
BUN: 14 mg/dL (ref 8–23)
CO2: 22 mmol/L (ref 22–32)
Calcium: 8.7 mg/dL — ABNORMAL LOW (ref 8.9–10.3)
Chloride: 107 mmol/L (ref 98–111)
Creatinine, Ser: 0.63 mg/dL (ref 0.44–1.00)
GFR, Estimated: >60 mL/min (ref >60)
Glucose, Bld: 112 mg/dL — ABNORMAL HIGH (ref 70–99)
Potassium: 3.9 mmol/L (ref 3.5–5.1)
Sodium: 136 mmol/L (ref 135–145)

## 2023-01-13 LAB — CBC
HCT: 38.4 % (ref 36.0–46.0)
Hemoglobin: 12.5 g/dL (ref 12.0–15.0)
MCH: 29.4 pg (ref 26.0–34.0)
MCHC: 32.6 g/dL (ref 30.0–36.0)
MCV: 90.4 fL (ref 80.0–100.0)
Platelets: 247 10*3/uL (ref 150–400)
RBC: 4.25 MIL/uL (ref 3.87–5.11)
RDW: 13.8 % (ref 11.5–15.5)
WBC: 8.8 10*3/uL (ref 4.0–10.5)
nRBC: 0 % (ref 0.0–0.2)

## 2023-01-13 MED ORDER — DILTIAZEM HCL ER COATED BEADS 120 MG PO CP24
120.0000 mg | ORAL_CAPSULE | Freq: Every day | ORAL | Status: DC
  Administered 2023-01-13: 120 mg via ORAL
  Filled 2023-01-13: qty 1

## 2023-01-13 NOTE — Progress Notes (Signed)
Pt being d/c, VSS, IV removed, Education complete  Alvis Lemmings, RN 01/13/2023 9:32 AM

## 2023-01-13 NOTE — Discharge Instructions (Addendum)
After Your Pacemaker   You have a Medtronic Pacemaker  ACTIVITY Do not lift your arm above shoulder height for 1 week after your procedure. After 7 days, you may progress as below.  You should remove your sling 24 hours after your procedure, unless otherwise instructed by your provider.     Tuesday January 20, 2023  Wednesday January 21, 2023 Thursday January 22, 2023 Friday January 23, 2023   Do not lift, push, pull, or carry anything over 10 pounds with the affected arm until 6 weeks (Tuesday February 24, 2023 ) after your procedure.   You may drive AFTER your wound check, unless you have been told otherwise by your provider.   Ask your healthcare provider when you can go back to work   INCISION/Dressing If you are on a blood thinner such as Coumadin, Xarelto, Eliquis, Plavix, or Pradaxa please confirm with your provider when this should be resumed.   If large square, outer bandage is left in place, this can be removed after 24 hours from your procedure. Do not remove steri-strips or glue as below.   If a PRESSURE DRESSING (a bulky dressing that usually goes up over your shoulder) was applied or left in place, please follow instructions given by your provider on when to return to have this removed.   Monitor your Pacemaker site for redness, swelling, and drainage. Call the device clinic at [phone] if you experience these symptoms or fever/chills.  If your incision is sealed with Steri-strips or staples, you may shower 7 days after your procedure or when told by your provider. Do not remove the steri-strips or let the shower hit directly on your site. You may wash around your site with soap and water.    If you were discharged in a sling, please do not wear this during the day more than 48 hours after your surgery unless otherwise instructed. This may increase the risk of stiffness and soreness in your shoulder.   Avoid lotions, ointments, or perfumes over your incision until it is  well-healed.  You may use a hot tub or a pool AFTER your wound check appointment if the incision is completely closed.  Pacemaker Alerts:  Some alerts are vibratory and others beep. These are NOT emergencies. Please call our office to let us know. If this occurs at night or on weekends, it can wait until the next business day. Send a remote transmission.  If your device is capable of reading fluid status (for heart failure), you will be offered monthly monitoring to review this with you.   DEVICE MANAGEMENT Remote monitoring is used to monitor your pacemaker from home. This monitoring is scheduled every 91 days by our office. It allows [place] to keep an eye on the functioning of your device to ensure it is working properly. You will routinely see your Electrophysiologist annually (more often if necessary).   You should receive your ID card for your new device in 4-8 weeks. Keep this card with you at all times once received. Consider wearing a medical alert bracelet or necklace.  Your Pacemaker may be MRI compatible. This will be discussed at your next office visit/wound check.  You should avoid contact with strong electric or magnetic fields.   Do not use amateur (ham) radio equipment or electric (arc) welding torches. MP3 player headphones with magnets should not be used. Some devices are safe to use if held at least 12 inches (30 cm) from your Pacemaker. These include power tools, lawn  mowers, and speakers. If you are unsure if something is safe to use, ask your health care provider.  When using your cell phone, hold it to the ear that is on the opposite side from the Pacemaker. Do not leave your cell phone in a pocket over the Pacemaker.  You may safely use electric blankets, heating pads, computers, and microwave ovens.  Call the office right away if: You have chest pain. You feel more short of breath than you have felt before. You feel more light-headed than you have felt before. Your  incision starts to open up.  This information is not intended to replace advice given to you by your health care provider. Make sure you discuss any questions you have with your health care provider.

## 2023-01-13 NOTE — TOC Transition Note (Addendum)
Transition of Care [facility]) - CM/SW Discharge Note   Patient Details  Name: Cathy Andrews MRN: [id number] Date of Birth: 09/14/1941  Transition of Care [facility]) CM/SW Contact:  Zenon Mayo, RN Phone Number: 01/13/2023, 8:03 AM   Clinical Narrative:    Patient is for dc today, has no needs. Spouse will transport her home.         Patient Goals and CMS Choice      Discharge Placement                         Discharge Plan and Services Additional resources added to the After Visit Summary for                                       Social Determinants of Health (SDOH) Interventions SDOH Screenings   Food Insecurity: No Food Insecurity (01/11/2023)  Housing: Low Risk  (01/11/2023)  Transportation Needs: No Transportation Needs (01/11/2023)  Utilities: Not At Risk (01/11/2023)  Alcohol Screen: Low Risk  (12/16/2022)  Depression (PHQ2-9): Low Risk  (12/16/2022)  Financial Resource Strain: Low Risk  (12/16/2022)  Physical Activity: Sufficiently Active (12/16/2022)  Social Connections: Moderately Integrated (12/16/2022)  Stress: No Stress Concern Present (12/16/2022)  Tobacco Use: Medium Risk (01/13/2023)     Readmission Risk Interventions     No data to display

## 2023-01-13 NOTE — Care Management Important Message (Signed)
Important Message  Patient Details  Name: Cathy Andrews MRN: [id number] Date of Birth: 03-Nov-1941   Medicare Important Message Given:  Yes     [name] 01/13/2023, 9:16 AM

## 2023-01-13 NOTE — Discharge Summary (Addendum)
ELECTROPHYSIOLOGY PROCEDURE DISCHARGE SUMMARY    Patient ID: Cathy Andrews,  MRN: [id number], DOB/AGE: November 11, 1940 82 y.o.  Admit date: 01/11/2023 Discharge date: 01/13/2023  Primary Care Physician: Leslie Dales, DO  Primary Cardiologist: Quay Burow, MD  Electrophysiologist: Dr. 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  Implantation of a Medtronic Dual Chamber PPM on 01/12/23 by Dr. Quentin Ore. The patient received a Medtronic Azure XT DR [id number]  with a Medtronic SelectSure 3830 right atrial lead and a Medtronic CapSureFix Novus 5076 right ventricular lead.  There were no immediate post procedure complications.   2.  CXR on 01/13/2023 demonstrated no pneumothorax status post device implantation.       Brief HPI: Cathy Andrews is a 82 y.o. female was admitted for pauses found on ambulatory monitor and electrophysiology team asked to see for consideration of PPM implantation.  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 The patient has had sinus node dysfunction without reversible causes identified. Updated echo had mild concentric left ventricular hypertrophy with a slightly reduced EF (55>45%). cMRI was performed with EF 43% without LGE.   Risks, benefits, and alternatives to PPM implantation were reviewed with the patient who wished to proceed.   Hospital Course:  The patient was admitted and underwent implantation of a Medtronic dual chamber PPM with details as outlined above.  She was monitored on telemetry overnight which demonstrated appropriate pacing.  Left chest was without hematoma or ecchymosis.  The device was interrogated and found to be functioning normally.  CXR was obtained and demonstrated no  pneumothorax status post device implantation.  Wound care, arm mobility, and restrictions were reviewed with the patient.  The patient was examined and considered stable for discharge to home.    Anticoagulation resumption This patient is not on anticoagulation        Physical Exam: Vitals:   01/12/23 1707 01/12/23 1851 01/12/23 1900 01/13/23 0403  BP:   (!) 151/75 (!) 142/59  Pulse:  60 73 76  Resp:  '18 18 18  '$ Temp:  97.6 F (36.4 C)  98.7 F (37.1 C)  TempSrc:  Oral  Oral  SpO2: 100% 95% 95% 97%  Weight:    56.6 kg  Height:        GEN- NAD. A&O x 3.  HEENT: Normocephalic, atraumatic Lungs- CTAB, Normal effort.  Heart- RRR, No M/G/R.  GI- Soft, NT, ND.  Extremities- No clubbing, cyanosis, or edema;  Skin- warm and dry, no rash or lesion, left chest without hematoma/ecchymosis. Steri-strips in place, no tethering  Discharge Medications:  Allergies as of 01/13/2023       Reactions   Sulfa Antibiotics Other (See Comments)   Unknown reaction        Medication List     TAKE these medications    CALCIUM PO Take 1 tablet by mouth daily.   cetirizine 10 MG tablet Commonly known as: ZYRTEC Take 1 tablet (10 mg total) by mouth daily. What changed: additional instructions   Cholecalciferol 50 MCG (2000 UT) Tabs Take 2,000 Units by mouth daily.   diclofenac Sodium 1 % Gel Commonly known as: Voltaren Apply 4 g topically 4 (four) times daily.   diltiazem 120 MG 24 hr capsule Commonly known as: CARDIZEM CD  Take 1 capsule by mouth daily.   FISH OIL PO Take 1 capsule by mouth daily.   HYDROcodone-acetaminophen 5-325 MG tablet Commonly known as: NORCO/VICODIN TAKE 1 TABLET BY MOUTH EVERY 4 TO 6 HOURS FOR 5 DAYS AS NEEDED FOR PAIN   ibuprofen 200 MG tablet Commonly known as: ADVIL Take 200 mg by mouth daily as needed for mild pain.   Lutein 10 MG Tabs 1 capsule with a meal   MELATONIN PO as needed.   MULTIVITAMIN PO Take 1 tablet by mouth daily.   RED  YEAST RICE PO Take 2 tablets by mouth daily.   VITAMIN B-12 PO Take 1 tablet by mouth daily.   Vitamin C 500 MG Chew Chew 500 mg by mouth daily.        Disposition:    Follow-up Information     [facility]. Go on 01/22/2023.   Specialty: Cardiology Why: 3/21 at 8:40am Contact information: [address], [place] [id number] [place] [phone]                Duration of Discharge Encounter: Greater than 30 minutes including physician time.  Signed, Mamie Levers, NP  01/13/2023 9:00 AM

## 2023-01-14 ENCOUNTER — Telehealth: Payer: Self-pay

## 2023-01-14 ENCOUNTER — Telehealth: Payer: Self-pay | Admitting: Cardiovascular Disease

## 2023-01-14 LAB — ECHOCARDIOGRAM COMPLETE
AR max vel: 1.68 cm2
AV Area VTI: 1.55 cm2
AV Area mean vel: 1.6 cm2
AV Mean grad: 4 mmHg
AV Peak grad: 6.8 mmHg
Ao pk vel: 1.3 m/s
Area-P 1/2: 3.27 cm2
Est EF: 45
Height: 62 in
S' Lateral: 3.3 cm
Weight: 1998.4 oz

## 2023-01-14 NOTE — Telephone Encounter (Signed)
Pt called stating that her elbow is swollen and sore to touch. Her pacemaker area looks good. I told her to elevate her arm to heart level. To rest it on some pillows. I told her to put ice on it and take tylenol for the pain.

## 2023-01-14 NOTE — Telephone Encounter (Signed)
Error

## 2023-01-14 NOTE — Transitions of Care (Post Inpatient/ED Visit) (Signed)
   01/14/2023  Name: Cathy Andrews MRN: [id number] DOB: Oct 25, 1941  Today's TOC FU Call Status: Today's TOC FU Call Status:: Unsuccessul Call (1st Attempt) Unsuccessful Call (1st Attempt) Date: 01/14/23  Attempted to reach the patient regarding the most recent Inpatient/ED visit.  Follow Up Plan: Additional outreach attempts will be made to reach the patient to complete the Transitions of Care (Post Inpatient/ED visit) call.   Johnney Killian, RN, BSN, CCM Care Management Coordinator [place] Phone: [phone]: [phone]

## 2023-01-15 ENCOUNTER — Telehealth: Payer: Self-pay

## 2023-01-15 NOTE — Transitions of Care (Post Inpatient/ED Visit) (Signed)
   01/15/2023  Name: Cathy Andrews MRN: [id number] DOB: 07-Aug-1941  Today's TOC FU Call Status: Today's TOC FU Call Status:: Unsuccessful Call (2nd Attempt) Unsuccessful Call (2nd Attempt) Date: 01/15/23  Attempted to reach the patient regarding the most recent Inpatient/ED visit.  Follow Up Plan: Additional outreach attempts will be made to reach the patient to complete the Transitions of Care (Post Inpatient/ED visit) call.   Johnney Killian, RN, BSN, CCM Care Management Coordinator [place] Phone: [phone]: [phone]

## 2023-01-16 ENCOUNTER — Telehealth: Payer: Self-pay

## 2023-01-16 NOTE — Transitions of Care (Post Inpatient/ED Visit) (Signed)
   01/16/2023  Name: Cathy Andrews MRN: [id number] DOB: [birth year]  Today's TOC FU Call Status: Today's TOC FU Call Status:: Unsuccessful Call (3rd Attempt) Unsuccessful Call (3rd Attempt) Date: 01/16/23  Attempted to reach the patient regarding the most recent Inpatient/ED visit.  Follow Up Plan: No further outreach attempts will be made at this time. We have been unable to contact the patient.  Johnney Killian, RN, BSN, CCM Care Management Coordinator [place] Phone: [phone]: [phone]

## 2023-01-22 ENCOUNTER — Ambulatory Visit: Payer: Medicare PPO | Attending: Internal Medicine

## 2023-01-22 DIAGNOSIS — I442 Atrioventricular block, complete: Secondary | ICD-10-CM | POA: Diagnosis not present

## 2023-01-22 LAB — CUP PACEART INCLINIC DEVICE CHECK
Battery Remaining Longevity: 167 mo
Battery Voltage: 3.22 V
Brady Statistic AP VP Percent: 0.23 %
Brady Statistic AP VS Percent: 32.24 %
Brady Statistic AS VP Percent: 0.33 %
Brady Statistic AS VS Percent: 67.2 %
Brady Statistic RA Percent Paced: 34.74 %
Brady Statistic RV Percent Paced: 0.56 %
Date Time Interrogation Session: 20240321144836
Implantable Lead Connection Status: 753985
Implantable Lead Connection Status: 753985
Implantable Lead Implant Date: 20240311
Implantable Lead Implant Date: 20240311
Implantable Lead Location: 753859
Implantable Lead Location: 753860
Implantable Lead Model: 3830
Implantable Lead Model: 5076
Implantable Pulse Generator Implant Date: 20240311
Lead Channel Impedance Value: 247 Ohm
Lead Channel Impedance Value: 380 Ohm
Lead Channel Impedance Value: 399 Ohm
Lead Channel Impedance Value: 532 Ohm
Lead Channel Pacing Threshold Amplitude: 0.25 V
Lead Channel Pacing Threshold Amplitude: 0.5 V
Lead Channel Pacing Threshold Pulse Width: 0.4 ms
Lead Channel Pacing Threshold Pulse Width: 0.4 ms
Lead Channel Sensing Intrinsic Amplitude: 2.875 mV
Lead Channel Sensing Intrinsic Amplitude: 3.625 mV
Lead Channel Sensing Intrinsic Amplitude: 7.375 mV
Lead Channel Sensing Intrinsic Amplitude: 7.875 mV
Lead Channel Setting Pacing Amplitude: 3.5 V
Lead Channel Setting Pacing Amplitude: 3.5 V
Lead Channel Setting Pacing Pulse Width: 0.4 ms
Lead Channel Setting Sensing Sensitivity: 1.2 mV
Zone Setting Status: 755011
Zone Setting Status: 755011

## 2023-01-22 NOTE — Patient Instructions (Signed)

## 2023-01-22 NOTE — Progress Notes (Signed)
Wound check appointment. Steri-strips removed. Wound without redness or edema. Incision edges approximated, wound well healed. Normal device function. Thresholds, sensing, and impedances consistent with implant measurements. Device programmed at 3.5V/auto capture programmed on for extra safety margin until 3 month visit. Histogram distribution appropriate for patient and level of activity. No mode switches. 14 NSVT events. Patient educated about wound care, arm mobility, lifting restrictions. ROV in 3 months with implanting physician.

## 2023-02-02 ENCOUNTER — Telehealth: Payer: Self-pay

## 2023-02-02 DIAGNOSIS — I10 Essential (primary) hypertension: Secondary | ICD-10-CM

## 2023-02-02 NOTE — Telephone Encounter (Signed)
Spoke with pt's husband, Lennette Bihari (ok per [facility]) regarding message from device nurse regarding statin therapy. Per Dr. Gwenlyn Found would like to see a fasting lipid panel and have pt back to be seen in the office. Appointment scheduled and orders placed for lipid panel. Instructions given for [facility] located in our office. Husband verbalizes understanding.

## 2023-02-02 NOTE — Telephone Encounter (Signed)
-----   Message from Simone Curia, RN sent at 01/23/2023  9:00 AM EDT ----- Please see message from Dr. Gwenlyn Found. ----- Message ----- From: Lorretta Harp, MD Sent: 01/23/2023   8:52 AM EDT To: Simone Curia, RN  She needs a FLP and then a ROV with me ----- Message ----- From: Simone Curia, RN Sent: 01/22/2023   3:02 PM EDT To: Lorretta Harp, MD; Vickie Epley, MD  Patient seen in device clinic and was advised in the hospital about possibly starting  statin medication but unable to provide any further information. Advised I will forward to MD to advise.

## 2023-02-24 NOTE — Telephone Encounter (Signed)
Spoke with pt and her husband (both on speaker). Pt did not get labs drawn. We will keep appointment for tomorrow and pt will plan to come fasting. We will get labs before pt leaves office tomorrow.

## 2023-02-24 NOTE — Telephone Encounter (Signed)
Left message for husband, [name] to call back regarding lab work and office visit scheduled for tomorrow.

## 2023-02-25 ENCOUNTER — Ambulatory Visit: Payer: Medicare PPO | Attending: Cardiovascular Disease | Admitting: Cardiovascular Disease

## 2023-02-25 ENCOUNTER — Encounter: Payer: Self-pay | Admitting: Cardiovascular Disease

## 2023-02-25 VITALS — BP 122/84 | HR 80 | Ht 62.0 in | Wt 126.6 lb

## 2023-02-25 DIAGNOSIS — I10 Essential (primary) hypertension: Secondary | ICD-10-CM | POA: Diagnosis not present

## 2023-02-25 DIAGNOSIS — R55 Syncope and collapse: Secondary | ICD-10-CM | POA: Diagnosis not present

## 2023-02-25 DIAGNOSIS — I519 Heart disease, unspecified: Secondary | ICD-10-CM | POA: Diagnosis not present

## 2023-02-25 NOTE — Assessment & Plan Note (Signed)
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  Based on this she underwent dual-chamber pacemaker insertion by Dr. Lalla Brothers 01/12/2023 and was discharged the following day.  Her wound is healing well.  She has had no recurrent symptoms.  She has an appointment to see Dr. Lalla Brothers back in June.

## 2023-02-25 NOTE — Assessment & Plan Note (Signed)
EF by 2D echo performed 01/12/2023 was 45% with global hypokinesis.  There are no significant valvular abnormalities.  There is grade 1 diastolic dysfunction.  This may be related to her rhythm abnormality which has been corrected.  Will recheck a 2D echo in 3 months.

## 2023-02-25 NOTE — Patient Instructions (Signed)
Medication Instructions:  Your physician recommends that you continue on your current medications as directed. Please refer to the Current Medication list given to you today.  *If you need a refill on your cardiac medications before your next appointment, please call your pharmacy*   Lab Work: Your physician recommends that you have labs drawn today: Lipid/liver panel  If you have labs (blood work) drawn today and your tests are completely normal, you will receive your results only by: MyChart Message (if you have MyChart) OR A paper copy in the mail If you have any lab test that is abnormal or we need to change your treatment, we will call you to review the results.   Testing/Procedures: Your physician has requested that you have an echocardiogram. Echocardiography is a painless test that uses sound waves to create images of your heart. It provides your doctor with information about the size and shape of your heart and how well your heart's chambers and valves are working. This procedure takes approximately one hour. There are no restrictions for this procedure. Please do NOT wear cologne, perfume, aftershave, or lotions (deodorant is allowed). Please arrive 15 minutes prior to your appointment time. This procedure will take place at [address]  **To do in 3 months (July)**    Follow-Up: At [facility], you and your health needs are our priority.  As part of our continuing mission to provide you with exceptional heart care, we have created designated Provider Care Teams.  These Care Teams include your primary Cardiologist (physician) and Advanced Practice Providers (APPs -  Physician Assistants and Nurse Practitioners) who all work together to provide you with the care you need, when you need it.  We recommend signing up for the patient portal called "MyChart".  Sign up information is provided on this After Visit Summary.  MyChart is used to connect with patients  for Virtual Visits (Telemedicine).  Patients are able to view lab/test results, encounter notes, upcoming appointments, etc.  Non-urgent messages can be sent to your provider as well.   To learn more about what you can do with MyChart, go to [email or web address].    Your next appointment:   07/01/23 @ 11am   Provider:   Nanetta Batty, MD

## 2023-02-25 NOTE — Progress Notes (Signed)
02/25/2023 Cathy Andrews   Feb 16, 1941  [id number]  Primary Physician Tiffany Kocher, DO Primary Cardiologist: Runell Gess MD Nicholes Calamity, [place]  HPI:  Cathy Andrews is a 82 y.o.  thin-appearing married Caucasian female mother of 2 children, grandmother of 3 grandchildren is accompanied by her husband [name] who is a Warden/ranger. She was referred by Dr. 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  I last saw her in the office 12/12/2022.  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 She has no cardiovascular risk factors. She is never had heart attack or stroke. She denies chest pain or shortness of breath. She is being evaluated for Alzheimer's disease. She has been on diltiazem by Dr. Buzzy Han at [facility] in [facility] for PVCs. She has had syncope in the past most recently 2 episodes since December that have occurred while in bed. There is no witnessed seizure activity. She did have a 2D echo back in 2022 which was normal an event monitor that showed frequent PVCs and PACs.   Since I saw her I did get a 2D echo that showed EF of 45% with global hypokinesia on 01/12/2023.  I placed a Zio patch 12/12/2022 which showed complete heart block explaining her syncopal episodes.  She separately underwent a dual-chamber pacemaker insertion by Dr. Lalla Brothers on 01/12/2023 and was discharged the following day.  Her wound is healing nicely.  She has had no recurrent episodes.  Current Meds  Medication Sig   Ascorbic Acid (VITAMIN C) 500 MG CHEW Chew 500 mg by mouth daily.   CALCIUM PO Take 1 tablet by mouth daily.   cetirizine (ZYRTEC) 10 MG tablet Take 1 tablet (10 mg total) by mouth daily. (Patient taking differently: Take 10 mg by mouth daily. As needed)   Cholecalciferol 50 MCG (2000 UT) TABS Take 2,000 Units by mouth daily.   Cyanocobalamin (VITAMIN B-12 PO) Take 1 tablet by mouth daily.   diclofenac Sodium (VOLTAREN) 1 % GEL  Apply 4 g topically 4 (four) times daily.   diltiazem (CARDIZEM CD) 120 MG 24 hr capsule Take 1 capsule by mouth daily.   HYDROcodone-acetaminophen (NORCO/VICODIN) 5-325 MG tablet    ibuprofen (ADVIL) 200 MG tablet Take 200 mg by mouth daily as needed for mild pain.   Lutein 10 MG TABS    MELATONIN PO as needed.   Multiple Vitamin (MULTIVITAMIN PO) Take 1 tablet by mouth daily.   Omega-3 Fatty Acids (FISH OIL PO) Take 1 capsule by mouth daily.   Red Yeast Rice Extract (RED YEAST RICE PO) Take 2 tablets by mouth daily.     Allergies  Allergen Reactions   Sulfa Antibiotics Other (See Comments)    Unknown reaction    Social History   Socioeconomic History   Marital status: Married    Spouse name: [name]   Number of children: 2   Years of education: Not on file   Highest education level: Master's degree (e.g., MA, MS, MEng, MEd, MSW, MBA)  Occupational History   Occupation: Retired  Tobacco Use   Smoking status: Former    Types: Cigarettes   Smokeless tobacco: Never  Vaping Use   Vaping Use: Never used  Substance and Sexual Activity   Alcohol use: Yes    Alcohol/week: 7.0 standard drinks of alcohol    Types: 7 Glasses of wine per week    Comment: usually 1 glass  of red wine daily   Drug use: Never    Comment: THC gummies very rarely, x2 in her lifetime   Sexual activity: Not on file  Other Topics Concern   Not on file  Social History Narrative   Lives at home with husband    Right handed   Caffeine: 1/2 caff coffee    Social Determinants of Health   Financial Resource Strain: Low Risk  (12/16/2022)   Overall Financial Resource Strain (CARDIA)    Difficulty of Paying Living Expenses: Not hard at all  Food Insecurity: No Food Insecurity (01/11/2023)   Hunger Vital Sign    Worried About Running Out of Food in the Last Year: Never true    Ran Out of Food in the Last Year: Never true  Transportation Needs: No Transportation Needs (01/11/2023)   PRAPARE - Therapist, art (Medical): No    Lack of Transportation (Non-Medical): No  Physical Activity: Sufficiently Active (12/16/2022)   Exercise Vital Sign    Days of Exercise per Week: 7 days    Minutes of Exercise per Session: 60 min  Stress: No Stress Concern Present (12/16/2022)   [employer] of Occupational Health - Occupational Stress Questionnaire    Feeling of Stress : Not at all  Social Connections: Moderately Integrated (12/16/2022)   Social Connection and Isolation Panel [NHANES]    Frequency of Communication with Friends and Family: Twice a week    Frequency of Social Gatherings with Friends and Family: Once a week    Attends Religious Services: Never    Database administrator or Organizations: No    Attends Engineer, structural: More than 4 times per year    Marital Status: Married  Catering manager Violence: Not At Risk (01/11/2023)   Humiliation, Afraid, Rape, and Kick questionnaire    Fear of Current or Ex-Partner: No    Emotionally Abused: No    Physically Abused: No    Sexually Abused: No     Review of Systems: General: negative for chills, fever, night sweats or weight changes.  Cardiovascular: negative for chest pain, dyspnea on exertion, edema, orthopnea, palpitations, paroxysmal nocturnal dyspnea or shortness of breath Dermatological: negative for rash Respiratory: negative for cough or wheezing Urologic: negative for hematuria Abdominal: negative for nausea, vomiting, diarrhea, bright red blood per rectum, melena, or hematemesis Neurologic: negative for visual changes, syncope, or dizziness All other systems reviewed and are otherwise negative except as noted above.    Blood pressure 122/84, pulse 80, height  (1.575 m), weight 126 lb 9.6 oz (57.4 kg).  General appearance: alert and no distress Neck: no adenopathy, no carotid bruit, no JVD, supple, symmetrical, trachea midline, and thyroid not enlarged, symmetric, no  tenderness/mass/nodules Lungs: clear to auscultation bilaterally Heart: regular rate and rhythm, S1, S2 normal, no murmur, click, rub or gallop Extremities: extremities normal, atraumatic, no cyanosis or edema Pulses: 2+ and symmetric Skin: Skin color, texture, turgor normal. No rashes or lesions Neurologic: Grossly normal  EKG AV paced rhythm with left bundle branch block at a rate of 80.  I personally reviewed this EKG.  ASSESSMENT AND PLAN:   Hypertension History of essential hypertension blood pressure measured today at 122/84.  She is on diltiazem.  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  Based on this she underwent dual-chamber pacemaker insertion by Dr. Lalla Brothers 01/12/2023 and was discharged the following day.  Her wound is  healing well.  She has had no recurrent symptoms.  She has an appointment to see Dr. Lalla Brothers back in June.  Left ventricular dysfunction EF by 2D echo performed 01/12/2023 was 45% with global hypokinesis.  There are no significant valvular abnormalities.  There is grade 1 diastolic dysfunction.  This may be related to her rhythm abnormality which has been corrected.  Will recheck a 2D echo in 3 months.     Runell Gess MD FACP,FACC,FAHA, [facility] 02/25/2023 11:54 AM

## 2023-02-25 NOTE — Assessment & Plan Note (Signed)
History of essential hypertension blood pressure measured today at 122/84.  She is on diltiazem.

## 2023-02-26 LAB — HEPATIC FUNCTION PANEL
ALT: 35 IU/L — ABNORMAL HIGH (ref 0–32)
AST: 32 IU/L (ref 0–40)
Albumin: 4.3 g/dL (ref 3.7–4.7)
Alkaline Phosphatase: 82 IU/L (ref 44–121)
Bilirubin Total: 0.4 mg/dL (ref 0.0–1.2)
Bilirubin, Direct: 0.12 mg/dL (ref 0.00–0.40)
Total Protein: 6.5 g/dL (ref 6.0–8.5)

## 2023-02-26 LAB — LIPID PANEL
Chol/HDL Ratio: 3 ratio (ref 0.0–4.4)
Cholesterol, Total: 179 mg/dL (ref 100–199)
HDL: 60 mg/dL (ref >39)
LDL Chol Calc (NIH): 95 mg/dL (ref 0–99)
Triglycerides: 135 mg/dL (ref 0–149)
VLDL Cholesterol Cal: 24 mg/dL (ref 5–40)

## 2023-03-12 ENCOUNTER — Encounter: Payer: Self-pay | Admitting: Neurology

## 2023-03-12 ENCOUNTER — Ambulatory Visit: Payer: Medicare PPO | Admitting: Neurology

## 2023-03-12 VITALS — BP 123/74 | HR 76 | Ht 62.0 in | Wt 127.0 lb

## 2023-03-12 DIAGNOSIS — R413 Other amnesia: Secondary | ICD-10-CM

## 2023-03-12 DIAGNOSIS — F0390 Unspecified dementia without behavioral disturbance: Secondary | ICD-10-CM | POA: Diagnosis not present

## 2023-03-12 MED ORDER — MEMANTINE HCL 5 MG PO TABS: 5.0000 mg | ORAL_TABLET | Freq: Two times a day (BID) | ORAL | 5 refills | Status: DC

## 2023-03-12 NOTE — Patient Instructions (Addendum)
We will do additional blood work to assess your risk for alzheimer's dementia.   We will start you for your memory on Namenda (generic name: Memantine), starting at 5 mg once daily for 1 month, then increase to 1 pill twice daily thereafter. Please note that side effects may include, but are not limited to: nausea, confusion, hallucination, personality changes. If you are having mild side effects, try to stick with the treatment as these initial side effects may go away after the first 10-14 days.     Follow up in 4-6 months to see the nurse practitioner, we may be able to increase the memantine at the time.   Please stop drinking alcohol altogether (at least for now).   Try to hydrate better with water: 6-8 cups per day (8 oz size).

## 2023-03-12 NOTE — Progress Notes (Signed)
Subjective:    Patient ID: Cathy Andrews is a 82 y.o. female.  HPI    [name], MD, PhD [facility] [address] [address] [place], [place] [address]  Cathy Andrews is an 82 year old female with an underlying medical history of PVCs, hypertension, neck pain, facial paresthesias for which I had evaluated her in 2022, and history of syncope, who presents for follow-up consultation of her memory loss.  The patient is accompanied by her husband today.  I saw her at the request of her primary care provider on 12/02/2022, at which time she reported an approximately 7-year history of memory loss.  She reported that she had participated in the Aduhelm trial since 2017.  Her MMSE was 23 at the time.  She reported an episode of passing out.  She was advised to see cardiology.  We proceeded with blood work including A1c, TSH, RPR, ESR, CMP, vitamin B12..  Her A1c was in the prediabetes range.  Her vitamin B12 was elevated.  She was advised to proceed with a brain MRI as well as brain metabolic PET scan.  She had a brain MRI without contrast on 12/16/2022 and I reviewed the results:    IMPRESSION: MRI scan of the brain without contrast showing mild age-related changes of chronic small vessel disease and generalized cerebral atrophy.  No acute abnormalities.   Brain metabolic PET scan from 12/26/2022 showed: IMPRESSION: No decreased relative cortical metabolism within the parietal lobes to suggest Alzheimer's disease pathology.   Cerebral metabolic activity within normal limits.  She was notified of the test results.    Today, 03/12/2023: She reports doing about the same.  Her husband reports that she has had about 4 syncopal spells.  Of note, she presented to the emergency room on 01/11/2023 with irregular heartbeat.  EKG showed sinus bradycardia and left bundle branch block.  She was admitted to cardiology..  She had a Medtronic dual-chamber pacemaker placed on 01/12/2023  under Dr. Lalla Brothers.  She does not hydrate well with water per her husband, she estimates that she drinks about 1 cup of water.  She drinks soda with lunch, alcohol with dinner, usually 1 or 1-1/2 glasses.  She drinks half caff coffee in the morning.  She has not fallen recently.    Previously:   12/02/22: (She) reports an approximately 7-year history of memory loss including forgetfulness, as well as a family history of memory loss affecting her mom.  Mom lived to be 58 and died with Alzheimer's dementia.  She had confusion in her later years.  Dad lived to be 18.  She has 3 younger sisters none of whom had memory issues, 1 sister died of cancer in 01/31/2022. She participated in an Alzheimer's trial with Aduhelm from 2017 onwards, the trial was on hold for about a year and restarted in 2021.  As part of the trial she had scans including MRI and PET scan of the brain.  I do not have the reports of the brain MRI and PET scans.  These were primarily done to qualify the patient for the Alzheimer's trial. She reports 2 recent episodes of passing out, both happened when she was laying in bed and turned over.  She denies any warning signs, husband was there and did not notice any convulsion or twitching or foaming at the mouth, no bowel or bladder incontinence, she was out for about a minute.  She did not suddenly stand up.  She does report  a low blood pressure typically at home and the blood pressure in the clinic today is on the lower side.   She still drives, she drives mostly locally such as to the [facility].  She goes to the gym 3 mornings a week.  She tries to hydrate well but estimates that she drinks less than 6 cups of water per day, she drinks 1 glass of red wine per day, about 8 ounces.  She drinks 1 cup of half-and-half coffee per day.  She has not fallen recently.  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 She quit smoking over 40  years ago. She had a previous brain MRI without contrast as well as MRA head without contrast and MRA neck without contrast on 02/13/2021 and I reviewed the results: IMPRESSION: 1. Normal brain MRI for age.  No acute intracranial abnormality. 2. Normal intracranial MRA. 3. Normal MRA of the neck.   She is currently not on any medications for her memory. She had an EEG through our office on 04/03/2021 and I reviewed the results: CONCLUSION: This is a  normal awake EEG.  There is no electrodiagnostic evidence of epileptiform discharge.   She was notified by phone call and the report was sent to her PCP at the time.   Previously:    02/20/21: 82 year old right-handed woman with a benign medical history, who reports intermittent right facial tingling.  Symptoms started about 3 weeks ago.  She has had intermittent lightheadedness, and had 1 syncopal spell at her eye doctors office some 3 weeks ago.  She was evaluated in the emergency room on 01/30/2021.  I reviewed the emergency room records.  She was found to be mildly confused.  Work-up otherwise showed nonfocal exam, troponin negative, EKG reassuring.  Rapid urine drug screen was positive for THC.  She reports that she took a THC gummy in the morning around 8 AM.  She had not done this before.  She had purchased the [facility] Gummies in [place] in November 2021 and both she and her husband had taken a half gummy at the time.  She had not used it again until that morning.  She felt off balance.  She has since then not taken any.  She reports intermittent tingling sensation in the right forehead and face, no sudden onset of one-sided weakness or numbness or tingling or droopy face or severe headache.  Sometimes she has tingling in the right arm and leg.  She has intermittently felt lightheaded but has not passed out since then.  A week ago she had worsening of her lightheadedness.  She went to her PCP office to the walk-in clinic and saw another provider.  Stroke  was suspected or needed to be ruled out and she was advised to go to the emergency room via EMS.  They decided to go by themselves.   She presented to the emergency room on 02/13/2021 with a complaint of dizziness.  She felt lightheaded, she had had symptoms for 2 weeks.  She reported a recent syncopal event.  I reviewed the emergency room records.  She had a brain MRI without contrast on 02/13/2021, as well as MR angiogram of the head and neck and I reviewed the results:  IMPRESSION: 1. Normal brain MRI for age.  No acute intracranial abnormality. 2. Normal intracranial MRA. 3. Normal MRA of the neck.   An EEG was planned through the  emergency room but due to mechanical issues they could not pursue it at the time. Blood work in the emergency room showed sodium of 136, potassium 3.8, glucose 101, BUN 18, creatinine 0.67, CBC showed WBC of 7, hemoglobin 13.2, hematocrit 40.8, normal platelets.  Magnesium was normal at 2.1.  Troponin was normal at 4.   She quit smoking many years ago, about 40 years ago.  She drinks alcohol daily in the form of wine, about 6 ounces per day.  She drinks water, 8 ounce bottles, about 4/day on average.  She drinks 1 cup of coffee in the morning and often soda with lunch.    Her Past Medical History Is Significant For: Past Medical History:  Diagnosis Date   Dizziness    Facial paresthesia    Laceration of right thumb    Pain in finger    Syncope     Her Past Surgical History Is Significant For: Past Surgical History:  Procedure Laterality Date   PACEMAKER IMPLANT N/A 01/12/2023   Procedure: PACEMAKER IMPLANT;  Surgeon: Lanier Prude, MD;  Location: [facility] INVASIVE [facility];  Service: Cardiovascular;  Laterality: N/A;   TONSILLECTOMY     URETER SURGERY      Her Family History Is Significant For: Family History  Problem Relation Age of Onset   Alzheimer's disease Mother    Breast cancer Neg Hx     Her Social History Is Significant For: Social History    Socioeconomic History   Marital status: Married    Spouse name: [name]   Number of children: 2   Years of education: Not on file   Highest education level: Master's degree (e.g., MA, MS, MEng, MEd, MSW, MBA)  Occupational History   Occupation: Retired  Tobacco Use   Smoking status: Former    Types: Cigarettes   Smokeless tobacco: Never  Vaping Use   Vaping Use: Never used  Substance and Sexual Activity   Alcohol use: Yes    Alcohol/week: 7.0 standard drinks of alcohol    Types: 7 Glasses of wine per week    Comment: usually 1 glass of red wine daily   Drug use: Never    Comment: THC gummies very rarely, x2 in her lifetime   Sexual activity: Not on file  Other Topics Concern   Not on file  Social History Narrative   Lives at home with husband    Right handed   Caffeine: 1/2 caff coffee    Social Determinants of Health   Financial Resource Strain: Low Risk  (12/16/2022)   Overall Financial Resource Strain (CARDIA)    Difficulty of Paying Living Expenses: Not hard at all  Food Insecurity: No Food Insecurity (01/11/2023)   Hunger Vital Sign    Worried About Running Out of Food in the Last Year: Never true    Ran Out of Food in the Last Year: Never true  Transportation Needs: No Transportation Needs (01/11/2023)   PRAPARE - Administrator, Civil Service (Medical): No    Lack of Transportation (Non-Medical): No  Physical Activity: Sufficiently Active (12/16/2022)   Exercise Vital Sign    Days of Exercise per Week: 7 days    Minutes of Exercise per Session: 60 min  Stress: No Stress Concern Present (12/16/2022)   [employer] of Occupational Health - Occupational Stress Questionnaire    Feeling of Stress : Not at all  Social Connections: Moderately Integrated (12/16/2022)   Social Connection and Isolation Panel [NHANES]  Frequency of Communication with Friends and Family: Twice a week    Frequency of Social Gatherings with Friends and Family: Once a week     Attends Religious Services: Never    Database administrator or Organizations: No    Attends Engineer, structural: More than 4 times per year    Marital Status: Married    Her Allergies Are:  Allergies  Allergen Reactions   Sulfa Antibiotics Other (See Comments)    Unknown reaction  :   Her Current Medications Are:  Outpatient Encounter Medications as of 03/12/2023  Medication Sig   Ascorbic Acid (VITAMIN C) 500 MG CHEW Chew 500 mg by mouth daily.   CALCIUM PO Take 1 tablet by mouth daily.   cetirizine (ZYRTEC) 10 MG tablet Take 1 tablet (10 mg total) by mouth daily. (Patient taking differently: Take 10 mg by mouth daily. As needed)   Cholecalciferol 50 MCG (2000 UT) TABS Take 2,000 Units by mouth daily.   Cyanocobalamin (VITAMIN B-12 PO) Take 1 tablet by mouth daily.   diclofenac Sodium (VOLTAREN) 1 % GEL Apply 4 g topically 4 (four) times daily.   diltiazem (CARDIZEM CD) 120 MG 24 hr capsule Take 1 capsule by mouth daily.   HYDROcodone-acetaminophen (NORCO/VICODIN) 5-325 MG tablet    ibuprofen (ADVIL) 200 MG tablet Take 200 mg by mouth daily as needed for mild pain.   Lutein 10 MG TABS    MELATONIN PO as needed.   Multiple Vitamin (MULTIVITAMIN PO) Take 1 tablet by mouth daily.   Omega-3 Fatty Acids (FISH OIL PO) Take 1 capsule by mouth daily.   Red Yeast Rice Extract (RED YEAST RICE PO) Take 2 tablets by mouth daily.   No facility-administered encounter medications on file as of 03/12/2023.  :   Review of Systems:  Out of a complete 14 point review of systems, all are reviewed and negative with the exception of these symptoms as listed below:  Review of Systems  Neurological:        Pt here for Memory loss f/u Pt wants to discuss Medication management for memory loss      Objective:  Neurological Exam  Physical Exam Physical Examination:   Vitals:   03/12/23 1034  BP: 123/74  Pulse: 76    General Examination: The patient is a very pleasant 82 y.o. female  in no acute distress. She appears well-developed and no acute distress and well groomed.   HEENT: Normocephalic, atraumatic, pupils are equal, round and reactive to light, extraocular tracking is good without limitation to gaze excursion or nystagmus noted. Hearing is grossly intact. Face is symmetric with normal facial animation. Speech is clear with no dysarthria noted. There is no hypophonia. There is no lip, neck/head, jaw or voice tremor. Neck is supple with full range of passive and active motion. There are no carotid bruits on auscultation. Oropharynx exam reveals: mild to moderate mouth dryness, adequate dental hygiene. Tongue protrudes centrally and palate elevates symmetrically.    Chest: Clear to auscultation without wheezing, rhonchi or crackles noted.  Healing scar from pacemaker placement, no tenderness.   Heart: S1+S2+0, regular and normal without murmurs, rubs or gallops noted.    Abdomen: Soft, non-tender and non-distended.   Extremities: There is no obvious edema in the distal lower extremities bilaterally.    Skin: Warm and dry without trophic changes noted.    Musculoskeletal: exam reveals no obvious joint deformities.    Neurologically:  Mental status: The patient is awake,  alert and oriented in all 4 spheres. Her immediate and remote memory, attention, language skills and fund of knowledge are impaired, she is unable to provide many details to her history and often looks to her husband for response.  She is slow in responding at times.  Mild word finding difficulty.  No dysarthria. Thought process is linear. Mood is normal and affect is normal.        12/02/2022    2:19 PM  MMSE - Mini Mental State Exam  Orientation to time 2  Orientation to Place 4  Registration 3  Attention/ Calculation 3  Recall 2  Language- name 2 objects 2  Language- repeat 1  Language- follow 3 step command 3  Language- read & follow direction 1  Write a sentence 1  Copy design 1  Total  score 23      On 12/02/2022: CDT: 4/4, AFT: 8/min.  Cranial nerves II - XII are as described above under HEENT exam.  Motor exam: Normal bulk, strength and tone is noted. There is no obvious action or resting tremor.  Reflexes are 1+ throughout. Fine motor skills and coordination: grossly intact.  Cerebellar testing: No dysmetria or intention tremor. There is no truncal or gait ataxia.  Sensory exam: intact to light touch in the upper and lower extremities.  Gait, station and balance: She stands easily. No veering to one side is noted. No leaning to one side is noted. Posture is age-appropriate and stance is narrow based. Gait shows normal stride length and normal pace. No problems turning are noted.    Assessment and Plan:  In summary, Cathy Andrews is an 82 year old female with an underlying medical history of PVCs, hypertension, neck pain, syncope and bradycardia with status post pacemaker placement, and facial paresthesias, who presents for evaluation of her memory loss of several years' duration.  Recent brain PET scan did not show telltale findings supporting Alzheimer's dementia.  MMSE in January 2023 was mildly abnormal, borderline on the moderate side. She also has some vascular risk factors.  She participated from 2017 onwards in the Aduhelm trial.  She had infusion in October 2023 for the last time with a dose of 10 mg/kg.  Trial participation was June 2021 through 2023.  She started participating in the study in 2016 and went through 2020 as understand.  She had MRI testing and PET scans as part of the participation in her trial.  She did provide some paperwork of her trial through [facility] and we will scan those papers into her chart.  We will proceed with additional blood work today, check ATN profile and APOE panel.  I suggested we avoid Aricept and related medications due to her history of bradycardia.  I recommend that we start her on low-dose memantine with gradual titration,  starting at 5 mg once daily and increase to 5 mg twice daily after about a month.  She is advised to eliminate alcohol for now.  She is advised to increase her water intake to about 6 to 8 cups of water per day.  I recommend she follow-up in about 4 to 6 months to see the nurse practitioner in this office, we can consider increasing the memantine at that time, we talked about possible common side effects and also some rare side effects.  She was given instructions in my chart.  Her husband will access her after visit summary he indicates.  I answered all the questions today and the patient and her husband  were in agreement.   I spent 30 minutes in total face-to-face time and in reviewing records during pre-charting, more than 50% of which was spent in counseling and coordination of care, reviewing test results, reviewing medications and treatment regimen and/or in discussing or reviewing the diagnosis of memory loss, the prognosis and treatment options. Pertinent laboratory and imaging test results that were available during this visit with the patient were reviewed by me and considered in my medical decision making (see chart for details).

## 2023-03-13 LAB — ATN PROFILE

## 2023-03-16 LAB — APOE ALZHEIMER'S RISK

## 2023-03-16 LAB — ATN PROFILE

## 2023-03-17 LAB — ATN PROFILE: T -- p-tau181: 1.51 pg/mL — ABNORMAL HIGH (ref 0.00–0.97)

## 2023-03-28 LAB — ATN PROFILE
A -- Beta-amyloid 42/40 Ratio: 0.104 (ref >0.102)
Beta-amyloid 40: 189.35 pg/mL
N -- NfL, Plasma: 5.08 pg/mL (ref 0.00–11.55)

## 2023-03-28 LAB — APOE ALZHEIMER'S RISK

## 2023-04-06 ENCOUNTER — Telehealth: Payer: Self-pay | Admitting: *Deleted

## 2023-04-06 ENCOUNTER — Telehealth: Payer: Self-pay

## 2023-04-06 DIAGNOSIS — G309 Alzheimer's disease, unspecified: Secondary | ICD-10-CM

## 2023-04-06 NOTE — Telephone Encounter (Signed)
LMVM for pt/ family member to call back. Re: lab results.  04-01-2023.  I called and LMVM for husband of pt (hi mobile) to call back for lab results.  I do see that he placed call to pcp for another neurology referral to Dr. Chauncey Cruel in [facility]

## 2023-04-06 NOTE — Telephone Encounter (Signed)
Patients husband calls nurse line requesting a new referral to Neurology.   He reports they are interested in seeing Dr. Chauncey Cruel in [facility] for cognitive concerns.   Will forward to PCP.

## 2023-04-06 NOTE — Telephone Encounter (Signed)
-----   Message from [name], MD sent at 03/31/2023 12:05 PM EDT ----- Her Alzheimer markers are not conclusively positive for higher risk for Alzheimer's disease.  1 test which is the ATN profile suggests findings not consistent with Alzheimer's pathology.  Her gene test called APOE shows findings that could put her at risk of late onset of Alzheimer's disease.  However this is not a specific finding.  We will proceed as planned with follow-up while on [place].

## 2023-04-08 NOTE — Telephone Encounter (Signed)
Spoke with patient's husband and patient.  Confirmed identity.  Patient and husband are requesting second opinion for treatment of Alzheimer's disease.  They request Dr. Chauncey Cruel, who they know from a prior clinical trial.  Referral sent.  Discussed continuing treatment as outlined by Dr. Frances Furbish, until patient can be seen for second opinion.

## 2023-04-16 ENCOUNTER — Ambulatory Visit (INDEPENDENT_AMBULATORY_CARE_PROVIDER_SITE_OTHER): Payer: Medicare PPO

## 2023-04-16 DIAGNOSIS — I442 Atrioventricular block, complete: Secondary | ICD-10-CM

## 2023-04-20 LAB — CUP PACEART REMOTE DEVICE CHECK
Battery Remaining Longevity: 170 mo
Battery Voltage: 3.2 V
Brady Statistic AP VP Percent: 0.45 %
Brady Statistic AP VS Percent: 49.73 %
Brady Statistic AS VP Percent: 0.21 %
Brady Statistic AS VS Percent: 49.61 %
Brady Statistic RA Percent Paced: 51.71 %
Brady Statistic RV Percent Paced: 0.66 %
Date Time Interrogation Session: 20240615150538
Implantable Lead Connection Status: 753985
Implantable Lead Connection Status: 753985
Implantable Lead Implant Date: 20240311
Implantable Lead Implant Date: 20240311
Implantable Lead Location: 753859
Implantable Lead Location: 753860
Implantable Lead Model: 3830
Implantable Lead Model: 5076
Implantable Pulse Generator Implant Date: 20240311
Lead Channel Impedance Value: 285 Ohm
Lead Channel Impedance Value: 380 Ohm
Lead Channel Impedance Value: 494 Ohm
Lead Channel Impedance Value: 532 Ohm
Lead Channel Pacing Threshold Amplitude: 0.375 V
Lead Channel Pacing Threshold Amplitude: 0.875 V
Lead Channel Pacing Threshold Pulse Width: 0.4 ms
Lead Channel Pacing Threshold Pulse Width: 0.4 ms
Lead Channel Sensing Intrinsic Amplitude: 2.5 mV
Lead Channel Sensing Intrinsic Amplitude: 2.5 mV
Lead Channel Sensing Intrinsic Amplitude: 8.25 mV
Lead Channel Sensing Intrinsic Amplitude: 8.25 mV
Lead Channel Setting Pacing Amplitude: 1.75 V
Lead Channel Setting Pacing Amplitude: 2 V
Lead Channel Setting Pacing Pulse Width: 0.4 ms
Lead Channel Setting Sensing Sensitivity: 1.2 mV
Zone Setting Status: 755011
Zone Setting Status: 755011

## 2023-04-29 ENCOUNTER — Encounter: Payer: Medicare PPO | Admitting: Cardiology

## 2023-05-03 DIAGNOSIS — R21 Rash and other nonspecific skin eruption: Secondary | ICD-10-CM | POA: Diagnosis not present

## 2023-05-03 DIAGNOSIS — T7840XA Allergy, unspecified, initial encounter: Secondary | ICD-10-CM | POA: Diagnosis not present

## 2023-05-11 NOTE — Progress Notes (Signed)
Remote pacemaker transmission.   

## 2023-05-13 NOTE — Progress Notes (Unsigned)
  Electrophysiology Office Follow up Visit Note:    Date:  05/13/2023   ID:  Cathy Andrews, DOB 1941-07-17, MRN [id number]  PCP:  Tiffany Kocher, DO  [facility] Cardiologist:  Nanetta Batty, MD  [facility] Electrophysiologist:  Lanier Prude, MD    Interval History:    Cathy Andrews is a 82 y.o. female who presents for a follow up visit.   She had a permanent pacemaker implanted on January 12, 2023 for intermittent complete heart block.  She has a Medtronic system.  Remote interrogations have shown stable device function.       Past medical, surgical, social and family history were reviewed.  ROS:   Please see the history of present illness.    All other systems reviewed and are negative.  EKGs/Labs/Other Studies Reviewed:    The following studies were reviewed today:  May 14, 2023 in clinic device interrogation personally reviewed ***       Physical Exam:    VS:  There were no vitals taken for this visit.    Wt Readings from Last 3 Encounters:  03/12/23 127 lb (57.6 kg)  02/25/23 126 lb 9.6 oz (57.4 kg)  01/13/23 124 lb 12.8 oz (56.6 kg)     GEN: *** Well nourished, well developed in no acute distress CARDIAC: ***RRR, no murmurs, rubs, gallops.  Pacemaker pocket well-healed RESPIRATORY:  Clear to auscultation without rales, wheezing or rhonchi       ASSESSMENT:    1. Complete heart block (HCC)   2. Cardiac pacemaker in situ   3. Primary hypertension    PLAN:    In order of problems listed above:  #Complete heart block #Symptomatic bradycardia #Permanent pacemaker in situ Doing well after permanent pacemaker implant.  Device interrogation shows stable device function.  Continue remote monitoring.  #Hypertension *** goal today.  Recommend checking blood pressures 1-2 times per week at home and recording the values.  Recommend bringing these recordings to the primary care physician.   Follow-up 1 year with  APP.      Signed, Steffanie Dunn, MD, [facility], [facility] 05/13/2023 4:20 PM    Electrophysiology [facility]

## 2023-05-13 NOTE — Progress Notes (Signed)
Electrophysiology Office Follow up Visit Note:    Date:  05/14/2023   ID:  Cathy Andrews, DOB [birth year], MRN [id number]  PCP:  Tiffany Kocher, DO  [facility] Cardiologist:  Nanetta Batty, MD  [facility] Electrophysiologist:  Lanier Prude, MD    Interval History:    Cathy Andrews is a 82 y.o. female who presents for a follow up visit.   She had a permanent pacemaker implanted on January 12, 2023 for intermittent complete heart block.  She has a [employer] system.  Remote interrogations have shown stable device function.  Today, she is accompanied by a family member. She states she is feeling okay. She denies any significant issues or pain associated with her incision site.  For exercise, on Mondays and Wednesdays goes to a class at the [facility]. Goes out for walks at least once a day weather permitting. She also walks around the house when she isn't able to go outside.  She has an echocardiogram scheduled for 05/20/23.  She denies any palpitations, chest pain, shortness of breath, peripheral edema, lightheadedness, headaches, syncope, orthopnea, or PND.     Past medical, surgical, social and family history were reviewed.  ROS:  Please see the history of present illness.  All other systems reviewed and are negative.  EKGs/Labs/Other Studies Reviewed:    The following studies were reviewed today:  May 14, 2023 in clinic device interrogation personally reviewed Battery longevity 14.1 years Lead parameter stable Atrial pacing 54% Ventricular pacing 0.5% 131 episodes of high ventricular rate, NSVT by EGM review  EKG Interpretation Date/Time:  Thursday May 14 2023 12:01:17 EDT Ventricular Rate:  81 PR Interval:  260 QRS Duration:  132 QT Interval:  400 QTC Calculation: 464 R Axis:   -32  Text Interpretation: Atrial-paced rhythm with prolonged AV conduction with occasional supraventricular complexes and with frequent Premature ventricular complexes Confirmed by  Steffanie Dunn [id number]) on 05/14/2023 12:06:09 PM    Physical Exam:    VS:  BP 104/60   Pulse 81   Ht 5\' 2"  (1.575 m)   Wt 127 lb 9.6 oz (57.9 kg)   SpO2 96%   BMI 23.34 kg/m     Wt Readings from Last 3 Encounters:  05/14/23 127 lb 9.6 oz (57.9 kg)  03/12/23 127 lb (57.6 kg)  02/25/23 126 lb 9.6 oz (57.4 kg)     GEN:  Well nourished, well developed in no acute distress CARDIAC: RRR, no murmurs, rubs, gallops.  Pacemaker pocket well-healed RESPIRATORY:  Clear to auscultation without rales, wheezing or rhonchi       ASSESSMENT:    1. Complete heart block (HCC)   2. Cardiac pacemaker in situ   3. Primary hypertension    PLAN:    In order of problems listed above:  #Complete heart block #Symptomatic bradycardia #Permanent pacemaker in situ Doing well after permanent pacemaker implant.  Device interrogation shows stable device function.  Continue remote monitoring.  #Hypertension At goal today.  Recommend checking blood pressures 1-2 times per week at home and recording the values.  Recommend bringing these recordings to the primary care physician.  #Frequent PVCs Continue diltiazem.  Repeat echo pending.  At this time, I would not increase her medical therapy or add an antiarrhythmic.  Follow-up 1 year with APP.  I,Mathew Stumpf,acting as a Neurosurgeon for Lanier Prude, MD.,have documented all relevant documentation on the behalf of Lanier Prude, MD,as directed by  Lanier Prude, MD while in the presence  of Lanier Prude, MD.  I, Lanier Prude, MD, have reviewed all documentation for this visit. The documentation on 05/14/23 for the exam, diagnosis, procedures, and orders are all accurate and complete.   Signed, Steffanie Dunn, MD, [facility], [facility] 05/14/2023 12:19 PM    Electrophysiology [facility]

## 2023-05-14 ENCOUNTER — Ambulatory Visit: Payer: Medicare PPO | Admitting: Cardiology

## 2023-05-14 ENCOUNTER — Encounter: Payer: Self-pay | Admitting: Cardiology

## 2023-05-14 VITALS — BP 104/60 | HR 81 | Ht 62.0 in | Wt 127.6 lb

## 2023-05-14 DIAGNOSIS — Z950 Presence of cardiac pacemaker: Secondary | ICD-10-CM

## 2023-05-14 DIAGNOSIS — I10 Essential (primary) hypertension: Secondary | ICD-10-CM | POA: Diagnosis not present

## 2023-05-14 DIAGNOSIS — I442 Atrioventricular block, complete: Secondary | ICD-10-CM | POA: Diagnosis not present

## 2023-05-14 NOTE — Patient Instructions (Signed)
Medication Instructions:  Your physician recommends that you continue on your current medications as directed. Please refer to the Current Medication list given to you today. *If you need a refill on your cardiac medications before your next appointment, please call your pharmacy*   Follow-Up: At [facility], you and your health needs are our priority.  As part of our continuing mission to provide you with exceptional heart care, we have created designated Provider Care Teams.  These Care Teams include your primary Cardiologist (physician) and Advanced Practice Providers (APPs -  Physician Assistants and Nurse Practitioners) who all work together to provide you with the care you need, when you need it.  We recommend signing up for the patient portal called "MyChart".  Sign up information is provided on this After Visit Summary.  MyChart is used to connect with patients for Virtual Visits (Telemedicine).  Patients are able to view lab/test results, encounter notes, upcoming appointments, etc.  Non-urgent messages can be sent to your provider as well.   To learn more about what you can do with MyChart, go to [email or web address].    Your next appointment:   1 year(s)  Provider:   Cameron Lambert, MD   

## 2023-05-20 ENCOUNTER — Ambulatory Visit (HOSPITAL_COMMUNITY): Payer: Medicare PPO | Attending: Internal Medicine

## 2023-05-20 DIAGNOSIS — R55 Syncope and collapse: Secondary | ICD-10-CM

## 2023-05-20 DIAGNOSIS — I10 Essential (primary) hypertension: Secondary | ICD-10-CM

## 2023-05-20 DIAGNOSIS — I519 Heart disease, unspecified: Secondary | ICD-10-CM | POA: Diagnosis not present

## 2023-05-20 LAB — ECHOCARDIOGRAM COMPLETE
Area-P 1/2: 3.33 cm2
P 1/2 time: 445 msec
S' Lateral: 3.6 cm

## 2023-05-21 DIAGNOSIS — H269 Unspecified cataract: Secondary | ICD-10-CM | POA: Diagnosis not present

## 2023-05-21 DIAGNOSIS — I2510 Atherosclerotic heart disease of native coronary artery without angina pectoris: Secondary | ICD-10-CM | POA: Diagnosis not present

## 2023-05-21 DIAGNOSIS — F0280 Dementia in other diseases classified elsewhere without behavioral disturbance: Secondary | ICD-10-CM | POA: Diagnosis not present

## 2023-05-21 DIAGNOSIS — G309 Alzheimer's disease, unspecified: Secondary | ICD-10-CM | POA: Diagnosis not present

## 2023-05-21 DIAGNOSIS — Z950 Presence of cardiac pacemaker: Secondary | ICD-10-CM | POA: Diagnosis not present

## 2023-05-21 DIAGNOSIS — I442 Atrioventricular block, complete: Secondary | ICD-10-CM | POA: Diagnosis not present

## 2023-05-21 DIAGNOSIS — Z8249 Family history of ischemic heart disease and other diseases of the circulatory system: Secondary | ICD-10-CM | POA: Diagnosis not present

## 2023-05-21 DIAGNOSIS — I499 Cardiac arrhythmia, unspecified: Secondary | ICD-10-CM | POA: Diagnosis not present

## 2023-05-21 DIAGNOSIS — Z87891 Personal history of nicotine dependence: Secondary | ICD-10-CM | POA: Diagnosis not present

## 2023-06-01 ENCOUNTER — Telehealth: Payer: Self-pay | Admitting: Cardiovascular Disease

## 2023-06-01 NOTE — Telephone Encounter (Signed)
Spoke with patient she has appointment scheduled for end of month. Do you guys want her to come sooner nobody had anything

## 2023-06-01 NOTE — Telephone Encounter (Signed)
Spoke with pt regarding sooner appointment with APP. Pt will be seen with Juanda Crumble, PA on 8/7 at 10:55am. Pt verbalizes understanding.

## 2023-06-01 NOTE — Telephone Encounter (Signed)
Patient returned RN's call. 

## 2023-06-05 ENCOUNTER — Telehealth: Payer: Self-pay

## 2023-06-05 NOTE — Telephone Encounter (Signed)
Called pt. LVM letting her know I have re faxed the referral to [facility].  Sunday Spillers, CMA

## 2023-06-09 NOTE — Progress Notes (Unsigned)
  Cardiology Office Note   Date:  06/10/2023  ID:  Cathy Andrews, DOB Oct 13, 1941, MRN [id number] PCP:  Tiffany Kocher, DO [facility] Cardiologist: Nanetta Batty, MD  Reason for visit: Follow-up echo results  History of Present Illness    Cathy Andrews is a 82 y.o. 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 Lalla Brothers 01/2023, EF 45%.  She folllowed up with Dr. Lalla Brothers 05/2023.  She continued exercising twice weekly at [facility] as well as outdoor walks.    Pt had repeat echo 05/2023 which showed worsening EF 30-35%, global hypokinesis with septal dyssynchrony, DDI, moderate MR, mild-mod TR.  She is here to follow-up on echo and start GDMT.  Today, patient states she feels well.  She denies chest pain, shortness of breath, PND, orthopnea, lower extremity edema, lightheadedness and syncope.  She continues to exercise regularly without issue.     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 Lalla Brothers -functioning well  Chronic systolic and diastolic heart failure -Echo 01/2023 with EF 45%, global hypokinesis, no significant valve disease -Echo 05/2023 with EF 30-35% --> reviewed with Dr. Lalla Brothers estimating EF 40% -Known LBBB, QRS>144ms -Cardiac MRI: no LGE to suggest myocardial scar, EF 43% -At current age and without angina, will defer on ischemic eval -Recommend stopping diltiazem -Start Toprol XL 25 mg once daily -Start Losartan 25 mg once daily (low dose given borderline blood pressure).   Follow-up BMET in 2 weeks.   -Recommended to call the office if significant lightheadedness.  If she does feel lightheaded, recommend she check her blood pressure at home. -Reviewed symptoms of heart failure and recommended to watch salt intake.  PVCs -Monitor palpitations with switching from diltiazem to Toprol.  Disposition - Follow-up as scheduled with Dr. Allyson Sabal.  Signed, Cannon Kettle, PA-C  06/10/2023 [facility]

## 2023-06-10 ENCOUNTER — Encounter: Payer: Self-pay | Admitting: Physician Assistant

## 2023-06-10 ENCOUNTER — Ambulatory Visit: Payer: Medicare PPO | Attending: Physician Assistant | Admitting: Physician Assistant

## 2023-06-10 VITALS — BP 108/70 | HR 65 | Ht 62.0 in | Wt 125.4 lb

## 2023-06-10 DIAGNOSIS — I5042 Chronic combined systolic (congestive) and diastolic (congestive) heart failure: Secondary | ICD-10-CM | POA: Diagnosis not present

## 2023-06-10 DIAGNOSIS — I10 Essential (primary) hypertension: Secondary | ICD-10-CM

## 2023-06-10 DIAGNOSIS — I442 Atrioventricular block, complete: Secondary | ICD-10-CM

## 2023-06-10 MED ORDER — METOPROLOL SUCCINATE ER 25 MG PO TB24: 25.0000 mg | ORAL_TABLET | Freq: Every day | ORAL | 3 refills | Status: DC

## 2023-06-10 MED ORDER — LOSARTAN POTASSIUM 25 MG PO TABS: 25.0000 mg | ORAL_TABLET | Freq: Every day | ORAL | 3 refills | Status: DC

## 2023-06-10 NOTE — Patient Instructions (Addendum)
Medication Instructions:  Stop Diltiazem. Start Metoprolol succinate 25 mg ( Take 1 Tablet Daily). Start Losartan 25 mg ( Take 1 Tablet Daily). *If you need a refill on your cardiac medications before your next appointment, please call your pharmacy*   Lab Work: BMET : To Be Done In 2 Weeks. If you have labs (blood work) drawn today and your tests are completely normal, you will receive your results only by: MyChart Message (if you have MyChart) OR A paper copy in the mail If you have any lab test that is abnormal or we need to change your treatment, we will call you to review the results.   Testing/Procedures: No Testing   Follow-Up: At [facility], you and your health needs are our priority.  As part of our continuing mission to provide you with exceptional heart care, we have created designated Provider Care Teams.  These Care Teams include your primary Cardiologist (physician) and Advanced Practice Providers (APPs -  Physician Assistants and Nurse Practitioners) who all work together to provide you with the care you need, when you need it.  We recommend signing up for the patient portal called "MyChart".  Sign up information is provided on this After Visit Summary.  MyChart is used to connect with patients for Virtual Visits (Telemedicine).  Patients are able to view lab/test results, encounter notes, upcoming appointments, etc.  Non-urgent messages can be sent to your provider as well.   To learn more about what you can do with MyChart, go to [email or web address].    Your next appointment:   Keep Scheduled Appointment  Provider:   Nanetta Batty, MD

## 2023-06-25 DIAGNOSIS — I5042 Chronic combined systolic (congestive) and diastolic (congestive) heart failure: Secondary | ICD-10-CM | POA: Diagnosis not present

## 2023-06-25 DIAGNOSIS — I10 Essential (primary) hypertension: Secondary | ICD-10-CM | POA: Diagnosis not present

## 2023-06-25 DIAGNOSIS — I442 Atrioventricular block, complete: Secondary | ICD-10-CM | POA: Diagnosis not present

## 2023-06-30 ENCOUNTER — Telehealth: Payer: Self-pay

## 2023-06-30 NOTE — Telephone Encounter (Addendum)
Called patient regarding results. Left message for patient to call office.----- Message from Cannon Kettle sent at 06/30/2023 12:42 PM EDT ----- Patient has normal electrolytes and creatinine.  Safe to continue losartan.

## 2023-07-01 ENCOUNTER — Ambulatory Visit: Payer: Medicare PPO | Admitting: Cardiovascular Disease

## 2023-07-01 ENCOUNTER — Ambulatory Visit: Payer: Medicare PPO | Attending: Cardiovascular Disease | Admitting: Cardiovascular Disease

## 2023-07-01 ENCOUNTER — Encounter: Payer: Self-pay | Admitting: Cardiovascular Disease

## 2023-07-01 VITALS — BP 134/74 | HR 81 | Ht 62.0 in | Wt 128.0 lb

## 2023-07-01 DIAGNOSIS — I519 Heart disease, unspecified: Secondary | ICD-10-CM

## 2023-07-01 DIAGNOSIS — R55 Syncope and collapse: Secondary | ICD-10-CM | POA: Diagnosis not present

## 2023-07-01 NOTE — Assessment & Plan Note (Signed)
History of syncope with complete heart block status post permanent transvenous pacemaker implantation by Dr. Steffanie Dunn March 2024.  She has not had recurrent syncope.

## 2023-07-01 NOTE — Progress Notes (Signed)
07/01/2023 Cathy Andrews   01-14-1941  [id number]  Primary Physician Tiffany Kocher, DO Primary Cardiologist: Runell Gess MD Nicholes Calamity, [place]  HPI:  Cathy Andrews is a 82 y.o.  thin-appearing married Caucasian female mother of 2 children, grandmother of 3 grandchildren is accompanied by her husband [name] who is a Warden/ranger. She was referred by Dr. 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  I last saw her in the office 02/25/2023.  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 She has no cardiovascular risk factors. She is never had heart attack or stroke. She denies chest pain or shortness of breath. She is being evaluated for Alzheimer's disease. She has been on diltiazem by Dr. Buzzy Han at [facility] in [facility] for PVCs. She has had syncope in the past most recently 2 episodes since December that have occurred while in bed. There is no witnessed seizure activity. She did have a 2D echo back in 2022 which was normal an event monitor that showed frequent PVCs and PACs.     I placed a Zio patch 12/12/2022 which showed complete heart block explaining her syncopal episodes.  She separately underwent a dual-chamber pacemaker insertion by Dr. Lalla Brothers on 01/12/2023 and was discharged the following day.  Her wound healed nicely.  She has had no recurrent episodes.  Since I saw her 6 months ago she has had no recurrent syncope.  Her most recent 2D echo performed 05/20/2023 revealed EF of 30 to 35% with moderate MR.  She is on a beta-blocker and losartan.  She works out at [employer] and adult fitness classes given the asymptomatic.   Current Meds  Medication Sig   Ascorbic Acid (VITAMIN C) 500 MG CHEW Chew 500 mg by mouth daily.   CALCIUM PO Take 1 tablet by mouth daily.   cetirizine (ZYRTEC) 10 MG tablet Take 1 tablet (10 mg total) by mouth daily. (Patient taking differently: Take 10 mg by mouth daily. As needed)    Cholecalciferol 50 MCG (2000 UT) TABS Take 2,000 Units by mouth daily.   Cyanocobalamin (VITAMIN B-12 PO) Take 1 tablet by mouth daily.   diclofenac Sodium (VOLTAREN) 1 % GEL Apply 4 g topically 4 (four) times daily.   losartan (COZAAR) 25 MG tablet Take 1 tablet (25 mg total) by mouth daily.   Lutein 10 MG TABS    MELATONIN PO as needed.   memantine (NAMENDA) 5 MG tablet Take 1 tablet (5 mg total) by mouth 2 (two) times daily. Follow instructions in after visit summary.   metoprolol succinate (TOPROL XL) 25 MG 24 hr tablet Take 1 tablet (25 mg total) by mouth daily.   Multiple Vitamin (MULTIVITAMIN PO) Take 1 tablet by mouth daily.   Omega-3 Fatty Acids (FISH OIL PO) Take 1 capsule by mouth daily.   Red Yeast Rice Extract (RED YEAST RICE PO) Take 2 tablets by mouth daily.     Allergies  Allergen Reactions   Sulfa Antibiotics Other (See Comments)    Unknown reaction    Social History   Socioeconomic History   Marital status: Married    Spouse name: [name]   Number of children: 2   Years of education: Not on file   Highest education level: Master's degree (e.g., MA, MS, MEng, MEd, MSW, MBA)  Occupational History   Occupation: Retired  Tobacco Use   Smoking status: Former  Types: Cigarettes   Smokeless tobacco: Never  Vaping Use   Vaping status: Never Used  Substance and Sexual Activity   Alcohol use: Yes    Alcohol/week: 7.0 standard drinks of alcohol    Types: 7 Glasses of wine per week    Comment: usually 1 glass of red wine daily   Drug use: Never    Comment: THC gummies very rarely, x2 in her lifetime   Sexual activity: Not on file  Other Topics Concern   Not on file  Social History Narrative   Lives at home with husband    Right handed   Caffeine: 1/2 caff coffee    Social Determinants of Health   Financial Resource Strain: Low Risk  (12/16/2022)   Overall Financial Resource Strain (CARDIA)    Difficulty of Paying Living Expenses: Not hard at all  Food  Insecurity: No Food Insecurity (01/11/2023)   Hunger Vital Sign    Worried About Running Out of Food in the Last Year: Never true    Ran Out of Food in the Last Year: Never true  Transportation Needs: No Transportation Needs (01/11/2023)   PRAPARE - Administrator, Civil Service (Medical): No    Lack of Transportation (Non-Medical): No  Physical Activity: Sufficiently Active (12/16/2022)   Exercise Vital Sign    Days of Exercise per Week: 7 days    Minutes of Exercise per Session: 60 min  Stress: No Stress Concern Present (12/16/2022)   [employer] of Occupational Health - Occupational Stress Questionnaire    Feeling of Stress : Not at all  Social Connections: Moderately Integrated (12/16/2022)   Social Connection and Isolation Panel [NHANES]    Frequency of Communication with Friends and Family: Twice a week    Frequency of Social Gatherings with Friends and Family: Once a week    Attends Religious Services: Never    Database administrator or Organizations: No    Attends Engineer, structural: More than 4 times per year    Marital Status: Married  Catering manager Violence: Not At Risk (01/11/2023)   Humiliation, Afraid, Rape, and Kick questionnaire    Fear of Current or Ex-Partner: No    Emotionally Abused: No    Physically Abused: No    Sexually Abused: No     Review of Systems: General: negative for chills, fever, night sweats or weight changes.  Cardiovascular: negative for chest pain, dyspnea on exertion, edema, orthopnea, palpitations, paroxysmal nocturnal dyspnea or shortness of breath Dermatological: negative for rash Respiratory: negative for cough or wheezing Urologic: negative for hematuria Abdominal: negative for nausea, vomiting, diarrhea, bright red blood per rectum, melena, or hematemesis Neurologic: negative for visual changes, syncope, or dizziness All other systems reviewed and are otherwise negative except as noted above.    Blood  pressure 134/74, pulse 81, height 5\' 2"  (1.575 m), weight 128 lb (58.1 kg), SpO2 97%.  General appearance: alert and no distress Neck: no adenopathy, no carotid bruit, no JVD, supple, symmetrical, trachea midline, and thyroid not enlarged, symmetric, no tenderness/mass/nodules Lungs: clear to auscultation bilaterally Heart: Regular rate and rhythm without murmurs gallops rubs or clicks Extremities: extremities normal, atraumatic, no cyanosis or edema Pulses: 2+ and symmetric Skin: Skin color, texture, turgor normal. No rashes or lesions Neurologic: Grossly normal  EKG not performed today      ASSESSMENT AND PLAN:   Syncope and collapse History of syncope with complete heart block status post permanent transvenous pacemaker implantation by Dr.  Steffanie Dunn March 2024.  She has not had recurrent syncope.  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  She is completely asymptomatic on metoprolol and losartan.       Runell Gess MD FACP,FACC,FAHA, [facility] 07/01/2023 11:35 AM

## 2023-07-01 NOTE — Patient Instructions (Signed)
Medication Instructions:  Your physician recommends that you continue on your current medications as directed. Please refer to the Current Medication list given to you today.  *If you need a refill on your cardiac medications before your next appointment, please call your pharmacy*   Follow-Up: At [facility], you and your health needs are our priority.  As part of our continuing mission to provide you with exceptional heart care, we have created designated Provider Care Teams.  These Care Teams include your primary Cardiologist (physician) and Advanced Practice Providers (APPs -  Physician Assistants and Nurse Practitioners) who all work together to provide you with the care you need, when you need it.  We recommend signing up for the patient portal called "MyChart".  Sign up information is provided on this After Visit Summary.  MyChart is used to connect with patients for Virtual Visits (Telemedicine).  Patients are able to view lab/test results, encounter notes, upcoming appointments, etc.  Non-urgent messages can be sent to your provider as well.   To learn more about what you can do with MyChart, go to [email or web address].    Your next appointment:   6 month(s)  Provider:   Juanda Crumble, PA-C      Then, Nanetta Batty, MD will plan to see you again in 12 month(s).

## 2023-07-01 NOTE — Assessment & Plan Note (Signed)
EF in the 30 to 35% range by 2D echo 05/20/2023 with moderate MR.  She is completely asymptomatic on metoprolol and losartan.

## 2023-07-07 ENCOUNTER — Other Ambulatory Visit: Payer: Self-pay

## 2023-07-07 ENCOUNTER — Telehealth: Payer: Self-pay | Admitting: Cardiovascular Disease

## 2023-07-07 ENCOUNTER — Telehealth: Payer: Self-pay

## 2023-07-07 MED ORDER — LOSARTAN POTASSIUM 25 MG PO TABS: 25.0000 mg | ORAL_TABLET | Freq: Every day | ORAL | 3 refills | Status: DC

## 2023-07-07 MED ORDER — METOPROLOL SUCCINATE ER 25 MG PO TB24: 25.0000 mg | ORAL_TABLET | Freq: Every day | ORAL | 3 refills | Status: DC

## 2023-07-07 NOTE — Telephone Encounter (Signed)
*  STAT* If patient is at the pharmacy, call can be transferred to refill team.   1. Which medications need to be refilled? (please list name of each medication and dose if known)   metoprolol succinate (TOPROL XL) 25 MG 24 hr tablet  memantine (NAMENDA) 5 MG tablet (new medication not started yet) losartan (COZAAR) 25 MG tablet   2. Would you like to learn more about the convenience, safety, & potential cost savings by using the [facility]?   3. Are you open to using the [facility] (Type [facility]. ).  4. Which pharmacy/location (including street and city if local pharmacy) is medication to be sent to?  [id number] - [place], [place] - [address]   5. Do they need a 30 day or 90 day supply?   90 day  Patient stated she is almost out/out of these medications.  Patient noted [facility].

## 2023-07-07 NOTE — Telephone Encounter (Addendum)
Called patient regarding results. Left detailed message for patient regarding results.----- Message from Cannon Kettle sent at 06/30/2023 12:42 PM EDT ----- Patient has normal electrolytes and creatinine.  Safe to continue losartan.

## 2023-07-09 ENCOUNTER — Telehealth: Payer: Self-pay

## 2023-07-09 NOTE — Telephone Encounter (Addendum)
Called patient regarding results. Left detailed message for patient regarding results. Letter mailed to patient on 07/09/23----- Message from Cannon Kettle sent at 06/30/2023 12:42 PM EDT ----- Patient has normal electrolytes and creatinine.  Safe to continue losartan.

## 2023-07-16 ENCOUNTER — Ambulatory Visit (INDEPENDENT_AMBULATORY_CARE_PROVIDER_SITE_OTHER): Payer: Medicare PPO

## 2023-07-16 DIAGNOSIS — R55 Syncope and collapse: Secondary | ICD-10-CM

## 2023-07-16 LAB — CUP PACEART REMOTE DEVICE CHECK
Battery Remaining Longevity: 165 mo
Battery Voltage: 3.17 V
Brady Statistic AP VP Percent: 0.3 %
Brady Statistic AP VS Percent: 70.71 %
Brady Statistic AS VP Percent: 0.05 %
Brady Statistic AS VS Percent: 28.94 %
Brady Statistic RA Percent Paced: 71.82 %
Brady Statistic RV Percent Paced: 0.34 %
Date Time Interrogation Session: 20240912035837
Implantable Lead Connection Status: 753985
Implantable Lead Connection Status: 753985
Implantable Lead Implant Date: 20240311
Implantable Lead Implant Date: 20240311
Implantable Lead Location: 753859
Implantable Lead Location: 753860
Implantable Lead Model: 3830
Implantable Lead Model: 5076
Implantable Pulse Generator Implant Date: 20240311
Lead Channel Impedance Value: 266 Ohm
Lead Channel Impedance Value: 380 Ohm
Lead Channel Impedance Value: 494 Ohm
Lead Channel Impedance Value: 532 Ohm
Lead Channel Pacing Threshold Amplitude: 0.375 V
Lead Channel Pacing Threshold Amplitude: 0.875 V
Lead Channel Pacing Threshold Pulse Width: 0.4 ms
Lead Channel Pacing Threshold Pulse Width: 0.4 ms
Lead Channel Sensing Intrinsic Amplitude: 2.375 mV
Lead Channel Sensing Intrinsic Amplitude: 2.375 mV
Lead Channel Sensing Intrinsic Amplitude: 8.625 mV
Lead Channel Sensing Intrinsic Amplitude: 8.625 mV
Lead Channel Setting Pacing Amplitude: 1.75 V
Lead Channel Setting Pacing Amplitude: 2 V
Lead Channel Setting Pacing Pulse Width: 0.4 ms
Lead Channel Setting Sensing Sensitivity: 1.2 mV
Zone Setting Status: 755011
Zone Setting Status: 755011

## 2023-07-24 ENCOUNTER — Telehealth: Payer: Self-pay | Admitting: Student

## 2023-07-24 NOTE — Telephone Encounter (Signed)
Left HIPAA compliant voicemail.   Please inform patient and her husband that Dr. Raynaldo Opitz office has declined the referral. Their office told us they do not do second opinions. Since patient was seen by Dr. Marcello Moores in the past, they may try to reach out if they still wish to pursue a second opinion through him.

## 2023-07-27 NOTE — Progress Notes (Signed)
Remote pacemaker transmission.   

## 2023-08-01 DIAGNOSIS — J029 Acute pharyngitis, unspecified: Secondary | ICD-10-CM | POA: Diagnosis not present

## 2023-08-01 DIAGNOSIS — J309 Allergic rhinitis, unspecified: Secondary | ICD-10-CM | POA: Diagnosis not present

## 2023-08-05 ENCOUNTER — Other Ambulatory Visit: Payer: Self-pay | Admitting: Neurology

## 2023-08-26 ENCOUNTER — Encounter: Payer: Self-pay | Admitting: Adult Health

## 2023-08-26 ENCOUNTER — Ambulatory Visit: Payer: Medicare PPO | Admitting: Adult Health

## 2023-08-26 VITALS — BP 112/52 | HR 79 | Ht 62.0 in | Wt 128.4 lb

## 2023-08-26 DIAGNOSIS — G3184 Mild cognitive impairment, so stated: Secondary | ICD-10-CM

## 2023-08-26 MED ORDER — MEMANTINE HCL 10 MG PO TABS: 10.0000 mg | ORAL_TABLET | Freq: Two times a day (BID) | ORAL | 5 refills | Status: DC

## 2023-08-26 NOTE — Patient Instructions (Addendum)
Your Plan:  Recommend increasing Namenda - continue 5mg  in the morning and increase to 10mg  (2 tabs) evening for 7 days then increase to 10mg  (2 tabs) twice daily   Will further discuss potential benefit with the newer infusible medications such as Leqembi (lecanemab) or Kisunla (donanemab) with Dr. Frances Furbish, we will call you to let you know what she recommends     Follow up in 6 months or call earlier if needed     Thank you for coming to see [place] at [facility]. I hope we have been able to provide you high quality care today.  You may receive a patient satisfaction survey over the next few weeks. We would appreciate your feedback and comments so that we may continue to improve ourselves and the health of our patients.

## 2023-08-26 NOTE — Progress Notes (Signed)
[facility] [address] [place]. [place] [address] [phone]       OFFICE FOLLOW UP NOTE  Ms. Cathy Andrews Date of Birth:  05-25-1941 Medical Record Number:  [id number]    Primary neurologist: Dr. Frances Furbish Reason for visit: memory loss    SUBJECTIVE:  CHIEF COMPLAINT:  Chief Complaint  Patient presents with   Follow-up    Patient in room #8 with her husband. Patient states she here today to follow up on her memory.    Follow-up visit:  Prior visit: 03/12/2023 Dr. Frances Furbish  Brief HPI:   Cathy Andrews is a 82 y.o. female who was initially evaluated by Dr. Frances Furbish on 12/02/2022 for complaints of memory loss over the past 7 years.  Noted family history of dementia affecting her mother.  She previously participated in Alzheimer's trial [facility] in 2017 - 2023.  She had MRI and PET scan as part of the trial but unable to view via epic.  She also complained about 2 syncopal events and advised follow-up with cardiology. MMSE 23/30. Recommended proceeding with lab work for reversible causes of memory loss and completion of MRI brain and PET scan.  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 Frances Furbish, recommended proceeding with additional lab work including ATN profile and APOE panel. She was started on Namenda, recommended avoidance of Aricept due to history of bradycardia. 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    Interval history:  Patient returns for follow-up visit accompanied by her husband.  She believes her memory is slightly improved although husband reports very slight decline since prior visit. Maintains ADLs and IADLs independently, continues to drive short distance without issues. She has a different time describing her specific memory concerns  and relies on her husband to answer questions but appears to have greater difficulty with short term memory. MMSE today 27/30 (prior 23/30). Continues on namenda 5mg  BID, denies side effects. She tries to stay active, enjoys going to the [facility]. Sleeps well, good appetite.  Routinely follows with PCP and cardiology.      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   Procedure: PACEMAKER IMPLANT;  Surgeon: Lanier Prude, MD;  Location: [facility] INVASIVE [facility];  Service: Cardiovascular;  Laterality: N/A;   TONSILLECTOMY     URETER SURGERY      Social History:  Social History   Socioeconomic History   Marital status: Married    Spouse name: Cathy Andrews   Number of children: 2   Years of education: Not on file   Highest education level: Master's degree (e.g., MA, MS, MEng, MEd, MSW, MBA)  Occupational History   Occupation: Retired  Tobacco Use   Smoking status: Former    Types: Cigarettes   Smokeless tobacco: Never  Vaping Use   Vaping status: Never Used  Substance and Sexual Activity   Alcohol use: Yes    Alcohol/week: 7.0 standard drinks of alcohol    Types:  7 Glasses of wine per week    Comment: usually 1 glass of red wine daily   Drug use: Never    Comment: THC gummies very rarely, x2 in her lifetime   Sexual activity: Not on file  Other Topics Concern   Not on file  Social History Narrative   Lives at home with husband    Right handed   Caffeine: 1/2 caff coffee    Social Determinants of Health   Financial Resource Strain: Low Risk  (12/16/2022)   Overall Financial Resource Strain (CARDIA)    Difficulty of Paying Living Expenses: Not hard at all  Food Insecurity: No Food Insecurity  (01/11/2023)   Hunger Vital Sign    Worried About Running Out of Food in the Last Year: Never true    Ran Out of Food in the Last Year: Never true  Transportation Needs: No Transportation Needs (01/11/2023)   PRAPARE - Administrator, Civil Service (Medical): No    Lack of Transportation (Non-Medical): No  Physical Activity: Sufficiently Active (12/16/2022)   Exercise Vital Sign    Days of Exercise per Week: 7 days    Minutes of Exercise per Session: 60 min  Stress: No Stress Concern Present (12/16/2022)   [employer] of Occupational Health - Occupational Stress Questionnaire    Feeling of Stress : Not at all  Social Connections: Moderately Integrated (12/16/2022)   Social Connection and Isolation Panel [NHANES]    Frequency of Communication with Friends and Family: Twice a week    Frequency of Social Gatherings with Friends and Family: Once a week    Attends Religious Services: Never    Database administrator or Organizations: No    Attends Engineer, structural: More than 4 times per year    Marital Status: Married  Catering manager Violence: Not At Risk (01/11/2023)   Humiliation, Afraid, Rape, and Kick questionnaire    Fear of Current or Ex-Partner: No    Emotionally Abused: No    Physically Abused: No    Sexually Abused: No    Family History:  Family History  Problem Relation Age of Onset   Alzheimer's disease Mother    Breast cancer Neg Hx     Medications:   Current Outpatient Medications on File Prior to Visit  Medication Sig Dispense Refill   Ascorbic Acid (VITAMIN C) 500 MG CHEW Chew 500 mg by mouth daily.     CALCIUM PO Take 1 tablet by mouth daily.     cetirizine (ZYRTEC) 10 MG tablet Take 1 tablet (10 mg total) by mouth daily. (Patient taking differently: Take 10 mg by mouth daily. As needed) 30 tablet 0   Cholecalciferol 50 MCG (2000 UT) TABS Take 2,000 Units by mouth daily.     Cyanocobalamin (VITAMIN B-12 PO) Take 1 tablet by mouth daily.      ibuprofen (ADVIL) 200 MG tablet Take 200 mg by mouth daily as needed for mild pain (pain score 1-3).     losartan (COZAAR) 25 MG tablet Take 1 tablet (25 mg total) by mouth daily. 90 tablet 3   Lutein 10 MG TABS      MELATONIN PO as needed.     memantine (NAMENDA) 5 MG tablet TAKE 1 TABLET (5 MG TOTAL) BY MOUTH 2 (TWO) TIMES DAILY. FOLLOW INSTRUCTIONS IN AFTER VISIT SUMMARY. 180 tablet 2   metoprolol succinate (TOPROL XL) 25 MG 24 hr tablet Take 1 tablet (25 mg total) by mouth daily. 90  tablet 3   Multiple Vitamin (MULTIVITAMIN PO) Take 1 tablet by mouth daily.     Omega-3 Fatty Acids (FISH OIL PO) Take 1 capsule by mouth daily.     Red Yeast Rice Extract (RED YEAST RICE PO) Take 2 tablets by mouth daily.     diclofenac Sodium (VOLTAREN) 1 % GEL Apply 4 g topically 4 (four) times daily. (Patient not taking: Reported on 08/26/2023) 4 g 1   HYDROcodone-acetaminophen (NORCO/VICODIN) 5-325 MG tablet  (Patient not taking: Reported on 07/01/2023)     No current facility-administered medications on file prior to visit.    Allergies:   Allergies  Allergen Reactions   Sulfa Antibiotics Other (See Comments)    Unknown reaction      OBJECTIVE:  Physical Exam  Vitals:   08/26/23 1312  BP: (!) 112/52  Pulse: 79  Weight: 128 lb 6.4 oz (58.2 kg)  Height: 5\' 2"  (1.575 m)   Body mass index is 23.48 kg/m. No results found.  General: well developed, well nourished, very pleasant elderly Caucasian female, seated, in no evident distress Head: head normocephalic and atraumatic.   Neck: supple with no carotid or supraclavicular bruits Cardiovascular: regular rate and rhythm, no murmurs Musculoskeletal: no deformity Skin:  no rash/petichiae Vascular:  Normal pulses all extremities   Neurologic Exam Mental Status: Awake and fully alert.  Fluent speech and language.  Recent memory impaired and remote memory intact. Attention span, concentration and fund of knowledge appropriate. Mood and  affect appropriate.  Cranial Nerves: Pupils equal, briskly reactive to light. Extraocular movements full without nystagmus. Visual fields full to confrontation. Hearing intact. Facial sensation intact. Face, tongue, palate moves normally and symmetrically.  Motor: Normal bulk and tone. Normal strength in all tested extremity muscles Gait and Station: Arises from chair without difficulty. Stance is normal. Gait demonstrates normal stride length and balance Reflexes: 1+ and symmetric. Toes downgoing.      08/26/2023    1:14 PM 12/02/2022    2:19 PM  MMSE - Mini Mental State Exam  Orientation to time 3 2  Orientation to Place 5 4  Registration 3 3  Attention/ Calculation 5 3  Recall 3 2  Language- name 2 objects 2 2  Language- repeat 1 1  Language- follow 3 step command 3 3  Language- read & follow direction 0 1  Write a sentence 1 1  Copy design 1 1  Total score 27 23          ASSESSMENT/PLAN: SHAYLEE TRACE is a 82 y.o. year old female with complaints of memory loss since around 2017, participated in Alzheimer's trial from 2017-2023 with noted improvement of memory, mother with history of Alzheimer's dementia      Mild cognitive impairment:  MMSE today 27/30 (prior 23/30) Continue memantine but will gradually increase to 10mg  BID (titration instructions provided in AVS) Per Dr. Frances Furbish, avoid Aricept due to bradycardia Husband questions other treatment options such as Leqembi or Kisunla - as of right now, would not be a candidate due to normal PET scan and essentially unremarkable ATN profile. Will discuss with Dr. Frances Furbish to get her opinion/recommendations regarding these medications and if repeat PET scan should be completed at any certain time as prior PET scan in 2017 for Alzheimer's study was abnormal making her a candidate for the study although unable to view results via epic.  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  This included previsit chart review, lab review, study review, order entry, electronic health record documentation, patient education and discussion regarding above diagnoses and treatment plan and answered all other questions to patient and husband's satisfaction  Ihor Austin, [facility]  [facility] [address] [place], [place] [address]  Phone [phone] Fax [phone] Note: This document was prepared with digital dictation and possible smart phrase technology. Any transcriptional errors that result from this process are unintentional.

## 2023-08-27 ENCOUNTER — Telehealth: Payer: Self-pay | Admitting: Neurology

## 2023-08-27 NOTE — Progress Notes (Signed)
Can you please advise patient/husband regarding Dr. Teofilo Pod recommendations with getting a second opinion with a memory disorder clinic to see if candidate for IV infusions as testing through our clinic did not favor Alzheimer's dementia, they would be able to further determine/recommend is a repeat PET scan would be needed. If interested, can place order to academic center for second opinion. Thank you.

## 2023-08-27 NOTE — Telephone Encounter (Signed)
Received a notification from [place] NP   "Can you please advise patient/husband regarding Dr. Teofilo Pod recommendations with getting a second opinion with a memory disorder clinic to see if candidate for IV infusions as testing through our clinic did not favor Alzheimer's dementia, they would be able to further determine/recommend is a repeat PET scan would be needed. If interested, can place order to academic center for second opinion. Thank you."

## 2023-10-15 ENCOUNTER — Ambulatory Visit: Payer: Medicare PPO

## 2023-10-15 DIAGNOSIS — R55 Syncope and collapse: Secondary | ICD-10-CM

## 2023-10-15 LAB — CUP PACEART REMOTE DEVICE CHECK
Battery Remaining Longevity: 160 mo
Battery Voltage: 3.13 V
Brady Statistic AP VP Percent: 0.21 %
Brady Statistic AP VS Percent: 81.82 %
Brady Statistic AS VP Percent: 0.03 %
Brady Statistic AS VS Percent: 17.94 %
Brady Statistic RA Percent Paced: 82.8 %
Brady Statistic RV Percent Paced: 0.24 %
Date Time Interrogation Session: 20241212030234
Implantable Lead Connection Status: 753985
Implantable Lead Connection Status: 753985
Implantable Lead Implant Date: 20240311
Implantable Lead Implant Date: 20240311
Implantable Lead Location: 753859
Implantable Lead Location: 753860
Implantable Lead Model: 3830
Implantable Lead Model: 5076
Implantable Pulse Generator Implant Date: 20240311
Lead Channel Impedance Value: 285 Ohm
Lead Channel Impedance Value: 361 Ohm
Lead Channel Impedance Value: 494 Ohm
Lead Channel Impedance Value: 513 Ohm
Lead Channel Pacing Threshold Amplitude: 0.5 V
Lead Channel Pacing Threshold Amplitude: 0.875 V
Lead Channel Pacing Threshold Pulse Width: 0.4 ms
Lead Channel Pacing Threshold Pulse Width: 0.4 ms
Lead Channel Sensing Intrinsic Amplitude: 3.375 mV
Lead Channel Sensing Intrinsic Amplitude: 3.375 mV
Lead Channel Sensing Intrinsic Amplitude: 9.625 mV
Lead Channel Sensing Intrinsic Amplitude: 9.625 mV
Lead Channel Setting Pacing Amplitude: 1.75 V
Lead Channel Setting Pacing Amplitude: 2 V
Lead Channel Setting Pacing Pulse Width: 0.4 ms
Lead Channel Setting Sensing Sensitivity: 1.2 mV
Zone Setting Status: 755011
Zone Setting Status: 755011

## 2023-11-19 NOTE — Progress Notes (Signed)
Remote pacemaker transmission.   

## 2023-12-09 DIAGNOSIS — Z1231 Encounter for screening mammogram for malignant neoplasm of breast: Secondary | ICD-10-CM | POA: Diagnosis not present

## 2023-12-09 DIAGNOSIS — Z6824 Body mass index (BMI) 24.0-24.9, adult: Secondary | ICD-10-CM | POA: Diagnosis not present

## 2023-12-09 DIAGNOSIS — Z01419 Encounter for gynecological examination (general) (routine) without abnormal findings: Secondary | ICD-10-CM | POA: Diagnosis not present

## 2023-12-29 NOTE — Progress Notes (Unsigned)
  Cardiology Office Note   Date:  12/30/2023  ID:  Cathy Andrews, DOB [birth year], MRN [id number] PCP:  Cathy Kocher, DO [facility] Cardiologist: Cathy Batty, MD  Reason for visit: 10-month follow-up  History of Present Illness    Cathy Andrews is a 83 y.o. female with a hx of  PVCs, PACs, syncope 2/2 complete heart block s/p PPM with Dr. Lalla Andrews 01/2023, EF 45%.   She folllowed up with Dr. Lalla Andrews 05/2023.  She continued exercising twice weekly at [facility] as well as outdoor walks.     Pt had repeat echo 05/2023 which showed worsening EF 30-35%, global hypokinesis with septal dyssynchrony, DDI, moderate MR, mild-mod TR.    I saw her on June 10, 2023.  She was feeling well without cardiac symptoms and exercising regularly.  For decreased EF around 40%, we stopped her diltiazem, started Toprol-XL 25 mg once daily and started losartan 25 mg once daily.  Dr. Allyson Andrews followed up with her on July 01, 2023 and patient was tolerating medications well.  Today, she feels well.  She continues to exercise at the [facility] 3 days a week and walks regularly.  She denies lightheadedness and syncope.  She denies palpitations with her PVCs.  No chest pain, shortness of breath or lower extremity edema.  No issues with her medications.   Objective / Physical Exam   EKG today: AV paced rhythm with PVCs, heart rate 77  Vital signs:  BP 137/76 (BP Location: Right Arm, Patient Position: Sitting, Cuff Size: Normal)   Pulse 77   Ht 5' 2.2" (1.58 m)   Wt 131 lb (59.4 kg)   SpO2 97%   BMI 23.81 kg/m     GEN: No acute distress NECK: No carotid bruits CARDIAC: RRR, no murmurs RESPIRATORY:  Clear to auscultation without rales, wheezing or rhonchi  EXTREMITIES: No edema  Assessment and Plan   Syncope 2/2 Complete Heart Block -s/p PPM 01/2023 -followed by EP - annual appointment in July   Chronic systolic and diastolic heart failure, euvolemic -Echo 01/2023 with EF 45%, global hypokinesis, no  significant valve disease -Echo 05/2023 with EF 30-35% --> reviewed with Dr. Lalla Andrews estimating EF 40% -Known LBBB, QRS>120ms -Cardiac MRI: no LGE to suggest myocardial scar, EF 43% -At current age and without angina, deferred ischemic eval -Continue losartan 25 mg daily and Toprol 25 mg daily.  Check BMET today.  She does not have much blood pressure room for med titration.     PVCs, asymptomatic -Continue Toprol XL 25 mg once daily.   Disposition - Follow-up in 6 months.   Signed, Cathy Kettle, PA-C  12/30/2023 [facility]

## 2023-12-30 ENCOUNTER — Ambulatory Visit: Payer: Medicare PPO | Attending: Physician Assistant | Admitting: Physician Assistant

## 2023-12-30 VITALS — BP 137/76 | HR 77 | Ht 62.2 in | Wt 131.0 lb

## 2023-12-30 DIAGNOSIS — I493 Ventricular premature depolarization: Secondary | ICD-10-CM

## 2023-12-30 DIAGNOSIS — I5042 Chronic combined systolic (congestive) and diastolic (congestive) heart failure: Secondary | ICD-10-CM

## 2023-12-30 NOTE — Patient Instructions (Signed)
 Medication Instructions:  No changes *If you need a refill on your cardiac medications before your next appointment, please call your pharmacy*   Lab Work: BMET If you have labs (blood work) drawn today and your tests are completely normal, you will receive your results only by: MyChart Message (if you have MyChart) OR A paper copy in the mail If you have any lab test that is abnormal or we need to change your treatment, we will call you to review the results.   Testing/Procedures: No Testing   Follow-Up: At [facility], you and your health needs are our priority.  As part of our continuing mission to provide you with exceptional heart care, we have created designated Provider Care Teams.  These Care Teams include your primary Cardiologist (physician) and Advanced Practice Providers (APPs -  Physician Assistants and Nurse Practitioners) who all work together to provide you with the care you need, when you need it.  We recommend signing up for the patient portal called "MyChart".  Sign up information is provided on this After Visit Summary.  MyChart is used to connect with patients for Virtual Visits (Telemedicine).  Patients are able to view lab/test results, encounter notes, upcoming appointments, etc.  Non-urgent messages can be sent to your provider as well.   To learn more about what you can do with MyChart, go to [email or web address].    Your next appointment:   6 month(s)  Provider:   Nanetta Batty, MD

## 2023-12-31 ENCOUNTER — Ambulatory Visit: Payer: Medicare PPO

## 2023-12-31 VITALS — Ht 62.0 in | Wt 130.2 lb

## 2023-12-31 DIAGNOSIS — Z0000 Encounter for general adult medical examination without abnormal findings: Secondary | ICD-10-CM | POA: Diagnosis not present

## 2023-12-31 LAB — BASIC METABOLIC PANEL
BUN/Creatinine Ratio: 38 — ABNORMAL HIGH (ref 12–28)
BUN: 21 mg/dL (ref 8–27)
CO2: 20 mmol/L (ref 20–29)
Calcium: 9.8 mg/dL (ref 8.7–10.3)
Chloride: 104 mmol/L (ref 96–106)
Creatinine, Ser: 0.55 mg/dL — ABNORMAL LOW (ref 0.57–1.00)
Glucose: 102 mg/dL — ABNORMAL HIGH (ref 70–99)
Potassium: 4.6 mmol/L (ref 3.5–5.2)
Sodium: 141 mmol/L (ref 134–144)
eGFR: 90 mL/min/{1.73_m2} (ref >59)

## 2023-12-31 NOTE — Progress Notes (Addendum)
 Subjective:   Cathy Andrews is a 83 y.o. who presents for a Medicare Wellness preventive visit.  Visit Complete: Virtual I connected with  Patterson Hammersmith on 12/31/23 by a audio enabled telemedicine application and verified that I am speaking with the correct person using two identifiers.  Patient Location: Home  Provider Location: Office/Clinic  I discussed the limitations of evaluation and management by telemedicine. The patient expressed understanding and agreed to proceed.  Vital Signs: Because this visit was a virtual/telehealth visit, some criteria may be missing or patient reported. Any vitals not documented were not able to be obtained and vitals that have been documented are patient reported.  VideoDeclined- This patient declined Librarian, academic. Therefore the visit was completed with audio only.  AWV Questionnaire: No: Patient Medicare AWV questionnaire was not completed prior to this visit.  Cardiac Risk Factors include: advanced age (>61men, >84 women)     Objective:    Today's Vitals   12/31/23 0847  Weight: 130 lb 3.2 oz (59.1 kg)  Height: 5\' 2"  (1.575 m)  PainSc: 0-No pain   Body mass index is 23.81 kg/m.     12/31/2023    8:50 AM 01/11/2023    6:00 PM 12/16/2022    8:45 AM 10/29/2022    2:51 PM 10/16/2022    9:16 AM 03/19/2022    1:31 PM  Advanced Directives  Does Patient Have a Medical Advance Directive? Yes No Yes No No No  Type of Estate agent of [place];Living will  Healthcare Power of [place];Living will     Does patient want to make changes to medical advance directive?   No - Patient declined     Copy of Healthcare Power of Attorney in Chart? No - copy requested  No - copy requested     Would patient like information on creating a medical advance directive?  No - Patient declined  No - Patient declined No - Patient declined No - Patient declined    Current Medications (verified) Outpatient  Encounter Medications as of 12/31/2023  Medication Sig   Ascorbic Acid (VITAMIN C) 500 MG CHEW Chew 500 mg by mouth daily.   CALCIUM PO Take 1 tablet by mouth daily.   cetirizine (ZYRTEC) 10 MG tablet Take 1 tablet (10 mg total) by mouth daily. (Patient taking differently: Take 10 mg by mouth daily. As needed)   Cholecalciferol 50 MCG (2000 UT) TABS Take 2,000 Units by mouth daily.   Cyanocobalamin (VITAMIN B-12 PO) Take 1 tablet by mouth daily.   diclofenac Sodium (VOLTAREN) 1 % GEL Apply 4 g topically 4 (four) times daily.   HYDROcodone-acetaminophen (NORCO/VICODIN) 5-325 MG tablet    ibuprofen (ADVIL) 200 MG tablet Take 200 mg by mouth daily as needed for mild pain (pain score 1-3).   losartan (COZAAR) 25 MG tablet Take 1 tablet (25 mg total) by mouth daily.   Lutein 10 MG TABS    MELATONIN PO as needed.   memantine (NAMENDA) 10 MG tablet Take 1 tablet (10 mg total) by mouth 2 (two) times daily.   metoprolol succinate (TOPROL XL) 25 MG 24 hr tablet Take 1 tablet (25 mg total) by mouth daily.   Multiple Vitamin (MULTIVITAMIN PO) Take 1 tablet by mouth daily.   Omega-3 Fatty Acids (FISH OIL PO) Take 1 capsule by mouth daily.   Red Yeast Rice Extract (RED YEAST RICE PO) Take 2 tablets by mouth daily.   No facility-administered encounter medications on  file as of 12/31/2023.    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   Procedure: PACEMAKER IMPLANT;  Surgeon: Lanier Prude, MD;  Location: [facility];  Service: Cardiovascular;  Laterality: N/A;   TONSILLECTOMY     URETER SURGERY     Family History  Problem Relation Age of Onset   Alzheimer's disease Mother    Breast cancer Neg Hx    Social History   Socioeconomic History   Marital  status: Married    Spouse name: [name]   Number of children: 2   Years of education: Not on file   Highest education level: Master's degree (e.g., MA, MS, MEng, MEd, MSW, MBA)  Occupational History   Occupation: Retired  Tobacco Use   Smoking status: Former    Types: Cigarettes   Smokeless tobacco: Never  Vaping Use   Vaping status: Never Used  Substance and Sexual Activity   Alcohol use: Yes    Alcohol/week: 7.0 standard drinks of alcohol    Types: 7 Glasses of wine per week    Comment: usually 1 glass of red wine daily   Drug use: Never    Comment: THC gummies very rarely, x2 in her lifetime   Sexual activity: Not on file  Other Topics Concern   Not on file  Social History Narrative   Lives at home with husband    Right handed   Caffeine: 1/2 caff coffee    Social Drivers of Corporate investment banker Strain: Low Risk  (12/31/2023)   Overall Financial Resource Strain (CARDIA)    Difficulty of Paying Living Expenses: Not hard at all  Food Insecurity: No Food Insecurity (12/31/2023)   Hunger Vital Sign    Worried About Running Out of Food in the Last Year: Never true    Ran Out of Food in the Last Year: Never true  Transportation Needs: No Transportation Needs (12/31/2023)   PRAPARE - Administrator, Civil Service (Medical): No    Lack of Transportation (Non-Medical): No  Physical Activity: Sufficiently Active (12/31/2023)   Exercise Vital Sign    Days of Exercise per Week: 7 days    Minutes of Exercise per Session: 60 min  Stress: No Stress Concern Present (12/31/2023)   [employer] of Occupational Health - Occupational Stress Questionnaire    Feeling of Stress : Not at all  Social Connections: Moderately Integrated (12/31/2023)   Social Connection and Isolation Panel [NHANES]    Frequency of Communication with Friends and Family: Twice a week    Frequency of Social Gatherings with Friends and Family: Once a week    Attends Religious Services: Never     Database administrator or Organizations: No    Attends Engineer, structural: More than 4 times per year    Marital Status: Married    Tobacco Counseling Counseling given: Not Answered    Clinical Intake:  Pre-visit preparation completed: Yes  Pain : No/denies pain Pain Score: 0-No pain     BMI - recorded: 23.81 Nutritional Status: BMI of 19-24  Normal Nutritional Risks: None Diabetes: No  How often do you need to have someone help you when you read instructions, pamphlets,  or other written materials from your doctor or pharmacy?: 1 - Never What is the last grade level you completed in school?: MASTER'S DEGREE IN [employer] [facility] [facility]  Interpreter Needed?: No  Information entered by :: Natajah Derderian N. Agastya Meister, LPN.   Activities of Daily Living     12/31/2023    8:56 AM 01/11/2023    6:00 PM  In your present state of health, do you have any difficulty performing the following activities:  Hearing? 0 0  Vision? 0 0  Difficulty concentrating or making decisions? 1 0  Walking or climbing stairs? 0 0  Dressing or bathing? 0 0  Doing errands, shopping? 0 0  Preparing Food and eating ? N   Using the Toilet? N   In the past six months, have you accidently leaked urine? N   Do you have problems with loss of bowel control? N   Managing your Medications? N   Managing your Finances? N   Housekeeping or managing your Housekeeping? N     Patient Care Team: Tiffany Kocher, DO as PCP - General (Family Medicine) Runell Gess, MD as PCP - Cardiology (Cardiology) Lanier Prude, MD as PCP - Electrophysiology (Cardiology) Carman Ching, OD as Consulting Physician (Optometry)  Indicate any recent Medical Services you may have received from other than [facility] providers in the past year (date may be approximate).     Assessment:   This is a routine wellness examination for [facility].  Hearing/Vision screen Hearing Screening - Comments:: Denies hearing difficulties. Vision Screening  - Comments:: Wears rx glasses (bifocals) - up to date with routine eye exams with Jimmye Norman, OD.    Goals Addressed   None    Depression Screen     12/31/2023    8:51 AM 12/16/2022    8:36 AM 10/29/2022    2:55 PM 10/16/2022    9:17 AM 03/21/2022    4:23 PM 03/19/2022    1:30 PM 04/18/2021    1:47 PM  PHQ 2/9 Scores  PHQ - 2 Score 0 0 0 0 0 0 0  PHQ- 9 Score 1 0 0 0 0 0 0    Fall Risk     12/31/2023    8:51 AM 12/16/2022    8:37 AM 10/29/2022    2:57 PM 10/16/2022    9:17 AM 03/19/2022    1:31 PM  Fall Risk   Falls in the past year? 0 0 0 0 0  Number falls in past yr: 0 0 0 0 0  Injury with Fall? 0 0 0 0 0  Risk for fall due to : No Fall Risks No Fall Risks     Follow up Falls prevention discussed;Falls evaluation completed Falls evaluation completed       MEDICARE RISK AT HOME:  Medicare Risk at Home Any stairs in or around the home?: Yes If so, are there any without handrails?: No Home free of loose throw rugs in walkways, pet beds, electrical cords, etc?: Yes Adequate lighting in your home to reduce risk of falls?: Yes Life alert?: No Use of a cane, walker or w/c?: No Grab bars in the bathroom?: Yes Shower chair or bench in shower?: Yes Elevated toilet seat or a handicapped toilet?: No  TIMED UP AND GO:  Was the test performed?  No  Cognitive Function: 6CIT completed    12/31/2023    8:55 AM 08/26/2023    1:14 PM 12/02/2022    2:19 PM  MMSE - Mini Mental State  Exam  Not completed: Unable to complete    Orientation to time  3 2  Orientation to Place  5 4  Registration  3 3  Attention/ Calculation  5 3  Recall  3 2  Language- name 2 objects  2 2  Language- repeat  1 1  Language- follow 3 step command  3 3  Language- read & follow direction  0 1  Write a sentence  1 1  Copy design  1 1  Total score  27 23        12/31/2023    8:53 AM 12/16/2022    8:39 AM  6CIT Screen  What Year? 0 points 4 points  What month? 0 points 0 points  What time? 0  points 0 points  Count back from 20 0 points 0 points  Months in reverse 0 points 0 points  Repeat phrase 0 points 0 points  Total Score 0 points 4 points    Immunizations Immunization History  Administered Date(s) Administered   Fluad Quad(high Dose 65+) 08/03/2022   Influenza Split 07/31/2017, 07/06/2019, 07/18/2023   Influenza, High Dose Seasonal PF 08/11/2018   PFIZER Comirnaty(Gray Top)Covid-19 Tri-Sucrose Vaccine 02/17/2021, 07/31/2021   PFIZER(Purple Top)SARS-COV-2 Vaccination 11/15/2019, 12/03/2019, 08/06/2020, 02/17/2021, 07/31/2021   PNEUMOCOCCAL CONJUGATE-20 03/19/2022   Pfizer(Comirnaty)Fall Seasonal Vaccine 12 years and older 08/03/2022, 07/18/2023   Pneumococcal Conjugate-13 07/26/2015   Pneumococcal Polysaccharide-23 10/06/2003, 05/13/2017   Respiratory Syncytial Virus Vaccine,Recomb Aduvanted(Arexvy) 10/22/2022   Td 11/20/2004   Tdap 05/13/2017, 07/08/2020   Zoster Recombinant(Shingrix) 07/30/2017, 11/08/2017   Zoster, Live 07/31/2017, 11/08/2017    Screening Tests Health Maintenance  Topic Date Due   Medicare Annual Wellness (AWV)  12/30/2024   DTaP/Tdap/Td (4 - Td or Tdap) 07/08/2030   Pneumonia Vaccine 4+ Years old  Completed   INFLUENZA VACCINE  Completed   DEXA SCAN  Completed   COVID-19 Vaccine  Completed   Zoster Vaccines- Shingrix  Completed   HPV VACCINES  Aged Out    Health Maintenance  There are no preventive care reminders to display for this patient. Health Maintenance Items Addressed: Yes   Additional Screening:  Vision Screening: Recommended annual ophthalmology exams for early detection of glaucoma and other disorders of the eye.  Dental Screening: Recommended annual dental exams for proper oral hygiene  Community Resource Referral / Chronic Care Management: CRR required this visit?  No   CCM required this visit?  No     Plan:     I have personally reviewed and noted the following in the patient's chart:   Medical and  social history Use of alcohol, tobacco or illicit drugs  Current medications and supplements including opioid prescriptions. Patient is currently taking opioid prescriptions. Information provided to patient regarding non-opioid alternatives. Patient advised to discuss non-opioid treatment plan with their provider. Functional ability and status Nutritional status Physical activity Advanced directives List of other physicians Hospitalizations, surgeries, and ER visits in previous 12 months Vitals Screenings to include cognitive, depression, and falls Referrals and appointments  In addition, I have reviewed and discussed with patient certain preventive protocols, quality metrics, and best practice recommendations. A written personalized care plan for preventive services as well as general preventive health recommendations were provided to patient.     Mickeal Needy, LPN   [id number]   After Visit Summary: (MyChart) Due to this being a telephonic visit, the after visit summary with patients personalized plan was offered to patient via MyChart   Notes: Nothing significant to  report at this time.

## 2023-12-31 NOTE — Patient Instructions (Addendum)
 Cathy Andrews , Thank you for taking time to come for your Medicare Wellness Visit. I appreciate your ongoing commitment to your health goals. Please review the following plan we discussed and let me know if I can assist you in the future.   Referrals/Orders/Follow-Ups/Clinician Recommendations: Yes; Keep maintaining your health by keeping your appointments with Dr. Claudean Severance and any specialists that you may see.  Call us if you need anything.  Have a great year!!!!  This is a list of the screening recommended for you and due dates:  Health Maintenance  Topic Date Due   Medicare Annual Wellness Visit  12/30/2024   DTaP/Tdap/Td vaccine (4 - Td or Tdap) 07/08/2030   Pneumonia Vaccine  Completed   Flu Shot  Completed   DEXA scan (bone density measurement)  Completed   COVID-19 Vaccine  Completed   Zoster (Shingles) Vaccine  Completed   HPV Vaccine  Aged Out    Advanced directives: (Copy Requested) Please bring a copy of your health care power of attorney and living will to the office to be added to your chart at your convenience.  Next Medicare Annual Wellness Visit scheduled for next year: Yes

## 2024-01-07 ENCOUNTER — Telehealth: Payer: Self-pay

## 2024-01-07 NOTE — Telephone Encounter (Addendum)
 Called patient regarding results. Patient had understanding of results----- Message from Cannon Kettle sent at 01/01/2024  1:34 PM EST ----- Normal electrolytes and kidney function. Continue current medications.

## 2024-01-14 ENCOUNTER — Ambulatory Visit: Payer: Medicare PPO

## 2024-01-14 DIAGNOSIS — R55 Syncope and collapse: Secondary | ICD-10-CM

## 2024-01-14 LAB — CUP PACEART REMOTE DEVICE CHECK
Battery Remaining Longevity: 150 mo
Battery Voltage: 3.08 V
Brady Statistic AP VP Percent: 44.62 %
Brady Statistic AP VS Percent: 34.62 %
Brady Statistic AS VP Percent: 11.45 %
Brady Statistic AS VS Percent: 9.32 %
Brady Statistic RA Percent Paced: 80.47 %
Brady Statistic RV Percent Paced: 56.06 %
Date Time Interrogation Session: 20250313055429
Implantable Lead Connection Status: 753985
Implantable Lead Connection Status: 753985
Implantable Lead Implant Date: 20240311
Implantable Lead Implant Date: 20240311
Implantable Lead Location: 753859
Implantable Lead Location: 753860
Implantable Lead Model: 3830
Implantable Lead Model: 5076
Implantable Pulse Generator Implant Date: 20240311
Lead Channel Impedance Value: 285 Ohm
Lead Channel Impedance Value: 361 Ohm
Lead Channel Impedance Value: 513 Ohm
Lead Channel Impedance Value: 513 Ohm
Lead Channel Pacing Threshold Amplitude: 0.5 V
Lead Channel Pacing Threshold Amplitude: 1 V
Lead Channel Pacing Threshold Pulse Width: 0.4 ms
Lead Channel Pacing Threshold Pulse Width: 0.4 ms
Lead Channel Sensing Intrinsic Amplitude: 2.875 mV
Lead Channel Sensing Intrinsic Amplitude: 2.875 mV
Lead Channel Sensing Intrinsic Amplitude: 9.5 mV
Lead Channel Sensing Intrinsic Amplitude: 9.5 mV
Lead Channel Setting Pacing Amplitude: 2 V
Lead Channel Setting Pacing Amplitude: 2 V
Lead Channel Setting Pacing Pulse Width: 0.4 ms
Lead Channel Setting Sensing Sensitivity: 1.2 mV
Zone Setting Status: 755011
Zone Setting Status: 755011

## 2024-01-17 ENCOUNTER — Encounter: Payer: Self-pay | Admitting: Cardiology

## 2024-02-04 ENCOUNTER — Telehealth: Payer: Self-pay | Admitting: Adult Health

## 2024-02-04 NOTE — Telephone Encounter (Signed)
 Pt reschedule appointment due to scheduling conflict.

## 2024-02-25 NOTE — Progress Notes (Signed)
 Remote pacemaker transmission.

## 2024-03-16 ENCOUNTER — Ambulatory Visit: Payer: Medicare PPO | Admitting: Adult Health

## 2024-04-05 ENCOUNTER — Other Ambulatory Visit: Payer: Self-pay | Admitting: Adult Health

## 2024-04-14 ENCOUNTER — Ambulatory Visit (INDEPENDENT_AMBULATORY_CARE_PROVIDER_SITE_OTHER): Payer: Medicare PPO

## 2024-04-14 DIAGNOSIS — R55 Syncope and collapse: Secondary | ICD-10-CM

## 2024-04-14 LAB — CUP PACEART REMOTE DEVICE CHECK
Battery Remaining Longevity: 140 mo
Battery Voltage: 3.05 V
Brady Statistic AP VP Percent: 81.28 %
Brady Statistic AP VS Percent: 0.04 %
Brady Statistic AS VP Percent: 14.55 %
Brady Statistic AS VS Percent: 4.13 %
Brady Statistic RA Percent Paced: 84.01 %
Brady Statistic RV Percent Paced: 95.83 %
Date Time Interrogation Session: 20250612022819
Implantable Lead Connection Status: 753985
Implantable Lead Connection Status: 753985
Implantable Lead Implant Date: 20240311
Implantable Lead Implant Date: 20240311
Implantable Lead Location: 753859
Implantable Lead Location: 753860
Implantable Lead Model: 3830
Implantable Lead Model: 5076
Implantable Pulse Generator Implant Date: 20240311
Lead Channel Impedance Value: 285 Ohm
Lead Channel Impedance Value: 342 Ohm
Lead Channel Impedance Value: 456 Ohm
Lead Channel Impedance Value: 494 Ohm
Lead Channel Pacing Threshold Amplitude: 0.5 V
Lead Channel Pacing Threshold Amplitude: 1 V
Lead Channel Pacing Threshold Pulse Width: 0.4 ms
Lead Channel Pacing Threshold Pulse Width: 0.4 ms
Lead Channel Sensing Intrinsic Amplitude: 3 mV
Lead Channel Sensing Intrinsic Amplitude: 3 mV
Lead Channel Sensing Intrinsic Amplitude: 9.5 mV
Lead Channel Sensing Intrinsic Amplitude: 9.5 mV
Lead Channel Setting Pacing Amplitude: 2 V
Lead Channel Setting Pacing Amplitude: 2 V
Lead Channel Setting Pacing Pulse Width: 0.4 ms
Lead Channel Setting Sensing Sensitivity: 1.2 mV
Zone Setting Status: 755011
Zone Setting Status: 755011

## 2024-04-16 ENCOUNTER — Ambulatory Visit: Payer: Self-pay | Admitting: Cardiology

## 2024-06-03 NOTE — Progress Notes (Addendum)
 [facility] [address] [place]. [place] [address] [phone]       OFFICE FOLLOW UP NOTE  Ms. Cathy Andrews Date of Birth:  1941/08/12 Medical Record Number:  [id number]    Primary neurologist: Dr. Buck Reason for visit: memory loss    SUBJECTIVE:  CHIEF COMPLAINT:  Chief Complaint  Patient presents with   Follow-up    RM 3, Pt w/spouse. Pt here for f/u from Oct 2024. Pt states she still has some concerns about memory and feels some things have worsened such as remembering people's names. Husband states Pt forgets times for appts and dates.    Follow-up visit:  Prior visit: 08/26/2023  Brief HPI:   Cathy Andrews is a 83 y.o. female who was initially evaluated by Dr. Buck on 12/02/2022 for complaints of memory loss over the past 7 years.  Noted family history of dementia affecting her mother.  She previously participated in Alzheimer's trial [facility] in 2017 - 2023.  She had MRI and PET scan as part of the trial but unable to view via epic. MMSE 23/30. 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 PET scan 12/2022 unremarkable.  ATN profile not consistent with Alzheimer's process.  At prior visit, subjectively noted cognitive decline although MMSE 27/30, previously 23/30.  Memantine  same dosage increased gradually to 10 mg twice daily.  Husband questioned treatment with antiamyloid therapy but would likely not be a candidate as further workup not consistent with Alzheimer's disease and MMSE technically within normal range.   Interval history:  Patient returns for follow-up visit accompanied by her husband.  Both patient and husband feel cognition has continued to gradually decline since prior visit, reports good days and bad days.  Continued issues more so with short-term memory. MMSE 24/30, remains on memantine  10 mg twice daily without side effects..  She continues to stay active routinely  going to the [facility].  Continues to maintain ADLs and IADLs independently as well as short distance driving without difficulties.  Reports she sleeps well and has a good appetite.  Continues to follow routinely with PCP and cardiology.     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   Procedure: PACEMAKER IMPLANT;  Surgeon: Cindie Ole DASEN, MD;  Location: [facility] INVASIVE CV LAB;  Service: Cardiovascular;  Laterality: N/A;   TONSILLECTOMY     URETER SURGERY      Social History:  Social History   Socioeconomic History   Marital status: Married    Spouse name: Cathy Andrews   Number of children: 2   Years of education: Not on file   Highest education level: Master's degree (e.g., MA, MS, MEng, MEd, MSW, MBA)  Occupational History   Occupation: Retired  Tobacco Use   Smoking status: Former    Types: Cigarettes   Smokeless tobacco: Never  Vaping Use   Vaping status: Never Used  Substance and Sexual Activity   Alcohol use: Yes    Alcohol/week: 7.0 standard drinks of alcohol    Types: 7 Glasses of wine per week    Comment: usually 1 glass  of red wine daily   Drug use: Never    Comment: THC gummies very rarely, x2 in her lifetime   Sexual activity: Not on file  Other Topics Concern   Not on file  Social History Narrative   Lives at home with husband    Right handed   Caffeine: 1/2 caff coffee    Social Drivers of Corporate investment banker Strain: Low Risk  (12/31/2023)   Overall Financial Resource Strain (CARDIA)    Difficulty of Paying Living Expenses: Not hard at all  Food Insecurity: No Food Insecurity (12/31/2023)   Hunger Vital Sign    Worried About Running Out of Food in the Last Year:  Never true    Ran Out of Food in the Last Year: Never true  Transportation Needs: No Transportation Needs (12/31/2023)   PRAPARE - Administrator, Civil Service (Medical): No    Lack of Transportation (Non-Medical): No  Physical Activity: Sufficiently Active (12/31/2023)   Exercise Vital Sign    Days of Exercise per Week: 7 days    Minutes of Exercise per Session: 60 min  Stress: No Stress Concern Present (12/31/2023)   [employer] of Occupational Health - Occupational Stress Questionnaire    Feeling of Stress : Not at all  Social Connections: Moderately Integrated (12/31/2023)   Social Connection and Isolation Panel    Frequency of Communication with Friends and Family: Twice a week    Frequency of Social Gatherings with Friends and Family: Once a week    Attends Religious Services: Never    Database administrator or Organizations: No    Attends Engineer, structural: More than 4 times per year    Marital Status: Married  Catering manager Violence: Not At Risk (12/31/2023)   Humiliation, Afraid, Rape, and Kick questionnaire    Fear of Current or Ex-Partner: No    Emotionally Abused: No    Physically Abused: No    Sexually Abused: No    Family History:  Family History  Problem Relation Age of Onset   Alzheimer's disease Mother    Breast cancer Neg Hx     Medications:   Current Outpatient Medications on File Prior to Visit  Medication Sig Dispense Refill   Ascorbic Acid (VITAMIN C) 500 MG CHEW Chew 500 mg by mouth daily.     CALCIUM PO Take 1 tablet by mouth daily.     cetirizine  (ZYRTEC ) 10 MG tablet Take 1 tablet (10 mg total) by mouth daily. (Patient taking differently: Take 10 mg by mouth daily as needed. As needed) 30 tablet 0   Cholecalciferol 50 MCG (2000 UT) TABS Take 3,000 Units by mouth daily.     Cyanocobalamin  (VITAMIN B-12 PO) Take 1 tablet by mouth daily.     ibuprofen (ADVIL) 200 MG tablet Take 200 mg by mouth daily as needed for mild pain  (pain score 1-3).     losartan  (COZAAR ) 25 MG tablet Take 1 tablet (25 mg total) by mouth daily. 90 tablet 3   Lutein 10 MG TABS      MELATONIN PO as needed.     metoprolol  succinate (TOPROL  XL) 25 MG 24 hr tablet Take 1 tablet (25 mg total) by mouth daily. 90 tablet 3   Multiple Vitamin (MULTIVITAMIN PO) Take 1 tablet by mouth daily.     Omega-3 Fatty Acids (FISH OIL PO) Take 1 capsule by mouth daily.     Red Yeast Rice Extract (RED YEAST  RICE PO) Take 2 tablets by mouth daily.     diclofenac  Sodium (VOLTAREN ) 1 % GEL Apply 4 g topically 4 (four) times daily. (Patient not taking: Reported on 06/06/2024) 4 g 1   No current facility-administered medications on file prior to visit.    Allergies:   Allergies  Allergen Reactions   Sulfa Antibiotics Other (See Comments)    Unknown reaction      OBJECTIVE:  Physical Exam  Vitals:   06/06/24 0755  BP: 118/66  Pulse: 72  Weight: 130 lb 12.8 oz (59.3 kg)  Height: 5' 2 (1.575 m)   Body mass index is 23.92 kg/m. No results found.  General: well developed, well nourished, very pleasant elderly Caucasian female, seated, in no evident distress Head: head normocephalic and atraumatic.   Neck: supple with no carotid or supraclavicular bruits Cardiovascular: regular rate and rhythm, no murmurs Musculoskeletal: no deformity Skin:  no rash/petichiae Vascular:  Normal pulses all extremities   Neurologic Exam Mental Status: Awake and fully alert.  Fluent speech and language.  Recent memory impaired and remote memory intact. Attention span, concentration and fund of knowledge appropriate. Mood and affect appropriate.  Cranial Nerves: Pupils equal, briskly reactive to light. Extraocular movements full without nystagmus. Visual fields full to confrontation. Hearing intact. Facial sensation intact. Face, tongue, palate moves normally and symmetrically.  Motor: Normal bulk and tone. Normal strength in all tested extremity muscles Gait and  Station: Arises from chair without difficulty. Stance is normal. Gait demonstrates normal stride length and balance Reflexes: 1+ and symmetric. Toes downgoing.      06/06/2024    7:57 AM 12/31/2023    8:55 AM 08/26/2023    1:14 PM 12/02/2022    2:19 PM  MMSE - Mini Mental State Exam  Not completed:  Unable to complete    Orientation to time 4  3 2   Orientation to Place 4  5 4   Registration 3  3 3   Attention/ Calculation 3  5 3   Recall 2  3 2   Language- name 2 objects 2  2 2   Language- repeat 0  1 1  Language- follow 3 step command 3  3 3   Language- read & follow direction 1  0 1  Write a sentence 1  1 1   Copy design 1  1 1   Total score 24  27 23            ASSESSMENT/PLAN: Cathy Andrews is a 83 y.o. year old female with complaints of memory loss since around 2017, participated in Alzheimer's trial from 2017-2023 with noted improvement of memory, mother with history of Alzheimer's dementia      Mild cognitive impairment:  MMSE today 24/30 (prior 27/30) Continue memantine  10 mg twice daily Start donepezil  5 mg nightly - discussed with Dr. Cindie who gave okay to start. Can consider dosage increase at f/u visit. Discussed potential side effects with patient and wishes to proceed with starting.  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 Buck for routine follow-up or call earlier if needed   CC:  PCP: Howell Lunger, DO    I personally spent a total of 25 minutes in the care of the patient today including preparing to see the patient, performing a medically appropriate exam/evaluation, counseling and educating, placing orders, referring and  communicating with other health care professionals, and documenting clinical information in the EHR.  Harlene Bogaert,  AGNP-BC  [facility] [address] [place], [place] [address]  Phone [phone] Fax [phone] Note: This document was prepared with digital dictation and possible smart phrase technology. Any transcriptional errors that result from this process are unintentional.

## 2024-06-06 ENCOUNTER — Ambulatory Visit: Admitting: Adult Health

## 2024-06-06 ENCOUNTER — Encounter: Payer: Self-pay | Admitting: Adult Health

## 2024-06-06 VITALS — BP 118/66 | HR 72 | Ht 62.0 in | Wt 130.8 lb

## 2024-06-06 DIAGNOSIS — G3184 Mild cognitive impairment, so stated: Secondary | ICD-10-CM | POA: Diagnosis not present

## 2024-06-06 MED ORDER — DONEPEZIL HCL 5 MG PO TABS: 5.0000 mg | ORAL_TABLET | Freq: Every day | ORAL | 11 refills | Status: AC

## 2024-06-06 MED ORDER — MEMANTINE HCL 10 MG PO TABS: 10.0000 mg | ORAL_TABLET | Freq: Two times a day (BID) | ORAL | 3 refills | Status: AC

## 2024-06-06 NOTE — Patient Instructions (Addendum)
 Will follow up with your cardiologist regarding use of Aricept  - we will call you and let you know if they are okay with you starting this  Continue Namenda  10mg  twice daily - refill sent to [facility]  Continue to do routine cognitive and phyiscal activity as well as ensuring good sleep, health diet and routine socialization      Follow up with Dr. Buck or call earlier if needed      Problems With Thinking and Memory (Mild Neurocognitive Disorder): What to Know Mild neurocognitive disorder, formerly known as mild cognitive impairment, is a disorder where your memory doesn't work as well as it should. It may also affect other mental abilities like thinking, communicating, behavior, and being able to finish tasks. These problems can be noticed and measured. But they usually don't stop you from doing daily activities or living on your own. Mild neurocognitive disorder usually happens after 85 years of age. But it can also happen at younger ages. It's not as serious as major neurocognitive disorder, also known as dementia, but it may be the first sign of it. In general, the symptoms of this condition get worse over time. In rare cases, symptoms can get better. What are the causes? This condition may be caused by: Brain disorders like Alzheimer's disease, Parkinson's disease, and other conditions that slowly damage nerve cells. Diseases that affect the blood vessels in the brain and cause small strokes. Certain infections, like HIV. Traumatic brain injury. Other medical conditions, such as brain tumors, underactive thyroid  (hypothyroidism), and not having enough vitamin B12. Using certain drugs or medicines. What increases the risk? Being older than 85 years of age. Being female. Having a lower level of education. Diabetes, high blood pressure, high cholesterol, and other conditions that raise the risk for blood vessel diseases. Untreated or undertreated sleep apnea. Having a certain type of  gene that can be inherited, or passed down from parent to child. Long-term health problems like heart disease, lung disease, liver disease, kidney disease, or depression. What are the signs or symptoms? Trouble remembering things. You may: Forget names, phone numbers, or details of recent events. Forget about social events and appointments. Often forget where you put your car keys or other items. Trouble thinking and solving problems. You may have trouble with complex tasks like: Paying bills. Driving in places you don't know well. Trouble communicating. You may have trouble: Finding the right word or naming an object. Forming a sentence that makes sense. Understanding what you read or hear. Changes in your behavior or personality. When this happens, you may: Lose interest in the things you used to enjoy. Avoid being around people. Get angry more easily than usual. Act before thinking. How is this diagnosed? This condition is diagnosed based on: Your symptoms. Your health care provider may ask you and the people you spend time with, like family and friends, about your symptoms. Memory tests and other tests to check how your brain is working. Your provider may refer you to a provider called a neurologist or a mental health specialist. To try to find out the cause of your condition, your provider may: Get a detailed medical history. Ask about use of alcohol, drugs, and medicines. Do a physical exam. Order blood tests and brain imaging tests. How is this treated? Mild neurocognitive disorder that's caused by medicine use, drug use, infection, or another medical condition may get better when the cause is treated, or when medicines or drugs are stopped. If this disorder has  another cause, it usually doesn't improve and may get worse. In these cases, the goal of treatment is to help you manage the symptoms. This may include: Medicines to help with memory and behavior symptoms. Talk therapy.  This provides education, emotional support, memory aids, and other ways of making up for problems with mental tasks. Lifestyle changes. These may include: Getting regular exercise. Eating a healthy diet that includes omega-3 fatty acids. Doing things to challenge your thinking and memory skills. Spending more time being with and talking to other people. Using routines like having regular times for meals and going to bed. Follow these instructions at home: Eating and drinking  Drink more fluids as told. Eat a healthy diet that includes omega-3 fatty acids. These can be found in: Fish. Nuts. Leafy vegetables. Vegetable oils. If you drink alcohol: Limit how much you have to: 0-1 drink a day if you're female. 0-2 drinks a day if you're female. Know how much alcohol is in your drink. In the [place] one drink is one 12 oz bottle of beer (355 mL), one 5 oz glass of wine (148 mL), or one 1 oz glass of hard liquor (44 mL). Lifestyle  Get regular exercise as told by your provider. Do not smoke, vape, or use nicotine or tobacco. Use healthy ways to manage stress. If you need help managing stress, ask your provider. Keep spending time with other people. Keep your mind active by doing activities you enjoy, like reading or playing games. Make sure you get good sleep at night. These tips can help: Try not to take naps during the day. Keep your bedroom dark and cool. Do not exercise in the few hours before you go to bed. Do not have foods or drinks with caffeine at night. General instructions Take medicines only as told. Your provider may tell you to avoid taking medicines that can affect thinking. These include some medicines for pain or sleeping. Work with your provider to find out: What things you need help with. What your safety needs are. Where to find more information [employer] on Aging: [email or web address] Contact a health care provider if: You have any new symptoms. Get help right  away if: You have new confusion or your confusion gets worse. You act in ways that put you or your family in danger. This information is not intended to replace advice given to you by your health care provider. Make sure you discuss any questions you have with your health care provider. Document Revised: 04/14/2023 Document Reviewed: 04/14/2023 Elsevier Patient Education  2024 [employer].

## 2024-06-06 NOTE — Addendum Note (Signed)
 Addended by: WHITFIELD RAISIN L on: 06/06/2024 09:36 AM   Modules accepted: Orders

## 2024-06-08 ENCOUNTER — Telehealth: Payer: Self-pay

## 2024-06-08 NOTE — Telephone Encounter (Signed)
-----   Message from Harlene Bogaert sent at 06/06/2024  9:36 AM EDT ----- Please call patient as she does not use MyChart - Dr. Cindie gave okay to start donepezil . Will start at 5mg  nightly and consider dosage increase if needed at f/u visit. Rx has been sent to [facility]. Thank you.

## 2024-06-08 NOTE — Progress Notes (Signed)
 Remote pacemaker transmission.

## 2024-06-08 NOTE — Telephone Encounter (Signed)
 Attempted to call Pt. No answer, unable to LVM. Per chart Pt last logged on to [facility] on 06/07/24. Will send message regarding NP recommendations.

## 2024-07-05 ENCOUNTER — Other Ambulatory Visit: Payer: Self-pay | Admitting: Cardiovascular Disease

## 2024-07-14 ENCOUNTER — Ambulatory Visit: Payer: Medicare PPO | Attending: Cardiology

## 2024-07-14 DIAGNOSIS — R55 Syncope and collapse: Secondary | ICD-10-CM | POA: Diagnosis not present

## 2024-07-14 LAB — CUP PACEART REMOTE DEVICE CHECK
Battery Remaining Longevity: 138 mo
Battery Voltage: 3.03 V
Brady Statistic AP VP Percent: 84.17 %
Brady Statistic AP VS Percent: 0.03 %
Brady Statistic AS VP Percent: 12.59 %
Brady Statistic AS VS Percent: 3.21 %
Brady Statistic RA Percent Paced: 85.94 %
Brady Statistic RV Percent Paced: 96.76 %
Date Time Interrogation Session: 20250911045358
Implantable Lead Connection Status: 753985
Implantable Lead Connection Status: 753985
Implantable Lead Implant Date: 20240311
Implantable Lead Implant Date: 20240311
Implantable Lead Location: 753859
Implantable Lead Location: 753860
Implantable Lead Model: 3830
Implantable Lead Model: 5076
Implantable Pulse Generator Implant Date: 20240311
Lead Channel Impedance Value: 304 Ohm
Lead Channel Impedance Value: 342 Ohm
Lead Channel Impedance Value: 475 Ohm
Lead Channel Impedance Value: 494 Ohm
Lead Channel Pacing Threshold Amplitude: 0.625 V
Lead Channel Pacing Threshold Amplitude: 0.875 V
Lead Channel Pacing Threshold Pulse Width: 0.4 ms
Lead Channel Pacing Threshold Pulse Width: 0.4 ms
Lead Channel Sensing Intrinsic Amplitude: 2.5 mV
Lead Channel Sensing Intrinsic Amplitude: 2.5 mV
Lead Channel Sensing Intrinsic Amplitude: 9.5 mV
Lead Channel Sensing Intrinsic Amplitude: 9.5 mV
Lead Channel Setting Pacing Amplitude: 1.75 V
Lead Channel Setting Pacing Amplitude: 2 V
Lead Channel Setting Pacing Pulse Width: 0.4 ms
Lead Channel Setting Sensing Sensitivity: 1.2 mV
Zone Setting Status: 755011
Zone Setting Status: 755011

## 2024-07-17 ENCOUNTER — Ambulatory Visit: Payer: Self-pay | Admitting: Cardiology

## 2024-07-22 NOTE — Progress Notes (Signed)
 Remote PPM Transmission

## 2024-08-30 DIAGNOSIS — E785 Hyperlipidemia, unspecified: Secondary | ICD-10-CM | POA: Diagnosis not present

## 2024-08-30 DIAGNOSIS — M8580 Other specified disorders of bone density and structure, unspecified site: Secondary | ICD-10-CM | POA: Diagnosis not present

## 2024-08-30 DIAGNOSIS — I110 Hypertensive heart disease with heart failure: Secondary | ICD-10-CM | POA: Diagnosis not present

## 2024-08-30 DIAGNOSIS — I442 Atrioventricular block, complete: Secondary | ICD-10-CM | POA: Diagnosis not present

## 2024-08-30 DIAGNOSIS — I495 Sick sinus syndrome: Secondary | ICD-10-CM | POA: Diagnosis not present

## 2024-08-30 DIAGNOSIS — I509 Heart failure, unspecified: Secondary | ICD-10-CM | POA: Diagnosis not present

## 2024-08-30 DIAGNOSIS — I2510 Atherosclerotic heart disease of native coronary artery without angina pectoris: Secondary | ICD-10-CM | POA: Diagnosis not present

## 2024-08-30 DIAGNOSIS — G309 Alzheimer's disease, unspecified: Secondary | ICD-10-CM | POA: Diagnosis not present

## 2024-08-30 DIAGNOSIS — Z8249 Family history of ischemic heart disease and other diseases of the circulatory system: Secondary | ICD-10-CM | POA: Diagnosis not present

## 2024-10-13 ENCOUNTER — Ambulatory Visit: Payer: Medicare PPO

## 2024-10-13 DIAGNOSIS — I493 Ventricular premature depolarization: Secondary | ICD-10-CM

## 2024-10-14 LAB — CUP PACEART REMOTE DEVICE CHECK
Battery Remaining Longevity: 134 mo
Battery Voltage: 3.03 V
Brady Statistic AP VP Percent: 88.59 %
Brady Statistic AP VS Percent: 0.02 %
Brady Statistic AS VP Percent: 7.5 %
Brady Statistic AS VS Percent: 3.89 %
Brady Statistic RA Percent Paced: 90.48 %
Brady Statistic RV Percent Paced: 96.09 %
Date Time Interrogation Session: 20251210212834
Implantable Lead Connection Status: 753985
Implantable Lead Connection Status: 753985
Implantable Lead Implant Date: 20240311
Implantable Lead Implant Date: 20240311
Implantable Lead Location: 753859
Implantable Lead Location: 753860
Implantable Lead Model: 3830
Implantable Lead Model: 5076
Implantable Pulse Generator Implant Date: 20240311
Lead Channel Impedance Value: 285 Ohm
Lead Channel Impedance Value: 342 Ohm
Lead Channel Impedance Value: 456 Ohm
Lead Channel Impedance Value: 494 Ohm
Lead Channel Pacing Threshold Amplitude: 0.625 V
Lead Channel Pacing Threshold Amplitude: 0.75 V
Lead Channel Pacing Threshold Pulse Width: 0.4 ms
Lead Channel Pacing Threshold Pulse Width: 0.4 ms
Lead Channel Sensing Intrinsic Amplitude: 2 mV
Lead Channel Sensing Intrinsic Amplitude: 2 mV
Lead Channel Sensing Intrinsic Amplitude: 9.5 mV
Lead Channel Sensing Intrinsic Amplitude: 9.5 mV
Lead Channel Setting Pacing Amplitude: 1.5 V
Lead Channel Setting Pacing Amplitude: 2 V
Lead Channel Setting Pacing Pulse Width: 0.4 ms
Lead Channel Setting Sensing Sensitivity: 1.2 mV
Zone Setting Status: 755011
Zone Setting Status: 755011

## 2024-10-16 ENCOUNTER — Ambulatory Visit: Payer: Self-pay | Admitting: Cardiology

## 2024-10-20 NOTE — Progress Notes (Signed)
 Remote PPM Transmission

## 2024-11-29 ENCOUNTER — Other Ambulatory Visit: Payer: Self-pay

## 2024-11-29 ENCOUNTER — Telehealth: Payer: Self-pay | Admitting: Cardiovascular Disease

## 2024-11-29 MED ORDER — LOSARTAN POTASSIUM 25 MG PO TABS: 25.0000 mg | ORAL_TABLET | Freq: Every day | ORAL | 0 refills | Status: AC

## 2024-11-29 NOTE — Telephone Encounter (Signed)
Refill was already sent today

## 2024-11-29 NOTE — Telephone Encounter (Signed)
" °*  STAT* If patient is at the pharmacy, call can be transferred to refill team.   1. Which medications need to be refilled? (please list name of each medication and dose if known) losartan  (COZAAR ) 25 MG tablet    2. Would you like to learn more about the convenience, safety, & potential cost savings by using the [facility]? No    3. Are you open to using the [facility] (Type [facility]. No   4. Which pharmacy/location (including street and city if local pharmacy) is medication to be sent to? [id number] - [place], [place] - [address]     5. Do they need a 30 day or 90 day supply? 90 day   "

## 2024-12-12 ENCOUNTER — Ambulatory Visit: Admitting: Neurology

## 2025-01-19 ENCOUNTER — Ambulatory Visit: Admitting: Neurology

## 2025-03-09 ENCOUNTER — Ambulatory Visit: Admitting: Neurology
